# Patient Record
Sex: Female | Born: 1950 | Race: Black or African American | Hispanic: No | Marital: Married | Smoking: Never smoker
Health system: Southern US, Community
[De-identification: ages and names within clinical notes are randomized; demographics above are authoritative.]

## PROBLEM LIST (undated history)

## (undated) DIAGNOSIS — Z8619 Personal history of other infectious and parasitic diseases: Secondary | ICD-10-CM

## (undated) DIAGNOSIS — N854 Malposition of uterus: Secondary | ICD-10-CM

## (undated) DIAGNOSIS — E119 Type 2 diabetes mellitus without complications: Secondary | ICD-10-CM

## (undated) DIAGNOSIS — I10 Essential (primary) hypertension: Secondary | ICD-10-CM

## (undated) DIAGNOSIS — N390 Urinary tract infection, site not specified: Secondary | ICD-10-CM

## (undated) HISTORY — DX: Personal history of other infectious and parasitic diseases: Z86.19

## (undated) HISTORY — DX: Essential (primary) hypertension: I10

## (undated) HISTORY — PX: EYE SURGERY: SHX253

## (undated) HISTORY — DX: Urinary tract infection, site not specified: N39.0

## (undated) HISTORY — PX: KNEE SURGERY: SHX244

## (undated) HISTORY — DX: Malposition of uterus: N85.4

## (undated) HISTORY — PX: KNEE ARTHROSCOPY: SUR90

## (undated) HISTORY — DX: Type 2 diabetes mellitus without complications: E11.9

---

## 2000-06-10 ENCOUNTER — Emergency Department (HOSPITAL_COMMUNITY): Admission: EM | Admit: 2000-06-10 | Discharge: 2000-06-10 | Payer: Self-pay | Admitting: Emergency Medicine

## 2000-12-01 ENCOUNTER — Emergency Department (HOSPITAL_COMMUNITY): Admission: EM | Admit: 2000-12-01 | Discharge: 2000-12-01 | Payer: Self-pay | Admitting: Emergency Medicine

## 2003-02-02 ENCOUNTER — Encounter: Payer: Self-pay | Admitting: Orthopedic Surgery

## 2003-02-03 ENCOUNTER — Ambulatory Visit (HOSPITAL_COMMUNITY): Admission: RE | Admit: 2003-02-03 | Discharge: 2003-02-03 | Payer: Self-pay | Admitting: Orthopedic Surgery

## 2005-05-27 ENCOUNTER — Emergency Department (HOSPITAL_COMMUNITY): Admission: EM | Admit: 2005-05-27 | Discharge: 2005-05-27 | Payer: Self-pay | Admitting: Emergency Medicine

## 2006-12-21 ENCOUNTER — Emergency Department (HOSPITAL_COMMUNITY): Admission: EM | Admit: 2006-12-21 | Discharge: 2006-12-22 | Payer: Self-pay | Admitting: Emergency Medicine

## 2008-04-24 ENCOUNTER — Encounter: Admission: RE | Admit: 2008-04-24 | Discharge: 2008-04-24 | Payer: Self-pay | Admitting: Internal Medicine

## 2009-04-12 ENCOUNTER — Emergency Department (HOSPITAL_COMMUNITY): Admission: EM | Admit: 2009-04-12 | Discharge: 2009-04-12 | Payer: Self-pay | Admitting: Emergency Medicine

## 2009-04-16 ENCOUNTER — Emergency Department (HOSPITAL_COMMUNITY): Admission: EM | Admit: 2009-04-16 | Discharge: 2009-04-16 | Payer: Self-pay | Admitting: Emergency Medicine

## 2009-04-21 ENCOUNTER — Emergency Department (HOSPITAL_COMMUNITY): Admission: EM | Admit: 2009-04-21 | Discharge: 2009-04-21 | Payer: Self-pay | Admitting: Emergency Medicine

## 2009-05-14 ENCOUNTER — Encounter: Admission: RE | Admit: 2009-05-14 | Discharge: 2009-06-18 | Payer: Self-pay | Admitting: Diagnostic Neuroimaging

## 2009-09-04 ENCOUNTER — Emergency Department (HOSPITAL_COMMUNITY): Admission: EM | Admit: 2009-09-04 | Discharge: 2009-09-04 | Payer: Self-pay | Admitting: Emergency Medicine

## 2010-12-21 LAB — URIC ACID: Uric Acid, Serum: 8.7 mg/dL — ABNORMAL HIGH (ref 2.4–7.0)

## 2010-12-21 LAB — CBC
HCT: 34.8 % — ABNORMAL LOW (ref 36.0–46.0)
Hemoglobin: 11.7 g/dL — ABNORMAL LOW (ref 12.0–15.0)
WBC: 6.1 10*3/uL (ref 4.0–10.5)

## 2010-12-21 LAB — ANA: Anti Nuclear Antibody(ANA): NEGATIVE

## 2010-12-21 LAB — URINE CULTURE: Colony Count: 2000

## 2010-12-21 LAB — COMPREHENSIVE METABOLIC PANEL
AST: 24 U/L (ref 0–37)
BUN: 17 mg/dL (ref 6–23)
CO2: 24 mEq/L (ref 19–32)
Calcium: 8.8 mg/dL (ref 8.4–10.5)
Chloride: 105 mEq/L (ref 96–112)
Creatinine, Ser: 0.67 mg/dL (ref 0.4–1.2)
GFR calc Af Amer: >60 mL/min (ref >60)
GFR calc non Af Amer: >60 mL/min (ref >60)
Glucose, Bld: 118 mg/dL — ABNORMAL HIGH (ref 70–99)
Total Bilirubin: 0.6 mg/dL (ref 0.3–1.2)

## 2010-12-21 LAB — URINALYSIS, ROUTINE W REFLEX MICROSCOPIC
Bilirubin Urine: NEGATIVE
Glucose, UA: NEGATIVE mg/dL
Ketones, ur: NEGATIVE mg/dL
Nitrite: NEGATIVE
Protein, ur: NEGATIVE mg/dL
pH: 5.5 (ref 5.0–8.0)

## 2010-12-21 LAB — URINE MICROSCOPIC-ADD ON

## 2010-12-21 LAB — DIFFERENTIAL
Basophils Absolute: 0 10*3/uL (ref 0.0–0.1)
Eosinophils Relative: 5 % (ref 0–5)
Lymphocytes Relative: 32 % (ref 12–46)
Neutrophils Relative %: 54 % (ref 43–77)

## 2010-12-21 LAB — SEDIMENTATION RATE: Sed Rate: 40 mm/hr — ABNORMAL HIGH (ref 0–22)

## 2010-12-21 LAB — RHEUMATOID FACTOR: Rhuematoid fact SerPl-aCnc: 20 IU/mL (ref 0–20)

## 2011-01-31 NOTE — Op Note (Signed)
NAME:  BEAU, VANDUZER                         ACCOUNT NO.:  [id number]   MEDICAL RECORD NO.:  [id number]                   PATIENT TYPE:  AMB   LOCATION:  DAY                                  FACILITY:  [facility]   PHYSICIAN:  Georges Lynch. Darrelyn Hillock, M.D.             DATE OF BIRTH:  07/26/1952   DATE OF PROCEDURE:  02/03/2003  DATE OF DISCHARGE:                                 OPERATIVE REPORT   SURGEON:  Georges Lynch. Darrelyn Hillock, M.D.   ASSISTANT:  Nurse.   PREOPERATIVE DIAGNOSES:  1. Degenerative arthritis medial joint space, left knee.  2. Chronic synovitis, left knee.  3. Tear of posterior horn medial meniscus, left knee.   POSTOPERATIVE DIAGNOSES:  1. Degenerative arthritis medial joint space, left knee.  2. Chronic synovitis, left knee.  3. Tear of posterior horn medial meniscus, left knee.   OPERATION:  1. Diagnostic arthroscopy, left knee.  2. Medial meniscectomy of the posterior horn, left knee.  3. Abrasion chondroplasty, medial femoral condyle, left knee.  4. Synovectomy suprapatellar pouch, left knee.   DESCRIPTION OF PROCEDURE:  Under general anesthesia, routine orthopedic prep  and draping of the left lower extremity carried out. At this time, small  punctate incision made in the suprapatellar pouch, inflow canula was  inserted and the knee distended with saline. At this time, another small  punctate incision was made in the anterolateral joint, the arthroscope was  entered, a complete diagnostic arthroscopy was carried out. Immediately upon  entering the knee, I noticed she had a chronic synovitis in her knee. I did  a synovectomy in the suprapatellar pouch utilizing the shaver suction  device. I went down in the lateral joint space, it was clear. In the medial  joint, she had a large chondral fracture off of the distal aspect of the  medial femoral condyle. I shaved this out with a shaver suction device.  Following this, I then went into the posterior horn and did a  partial medial  meniscectomy. I thoroughly irrigated out the knee. She had a definite  chondral fracture which goes along with her fall that she had at school and  this was really not of an arthritic nature. The torn cartilage was also  related to injury. I thoroughly irrigated out the knee, closed all three  punctate incisions with a 3-0 nylon suture. Following this, sterile  dressings were applied after we injected 30 mL of 0.5% Marcaine with  epinephrine into the knee joint.   FOLLOWUP CARE:  She will be on Percocet 10/650 for pain, Bufferin one twice  a day as an anticoagulant, and I will see her in the office in 12-14 days or  prior to if there is a problem.  Ronald A. Darrelyn Hillock, M.D.   RAG/MEDQ  D:  02/03/2003  T:  02/03/2003  Job:  [id number]

## 2011-04-04 ENCOUNTER — Other Ambulatory Visit: Payer: Self-pay | Admitting: Internal Medicine

## 2011-04-04 DIAGNOSIS — Z1231 Encounter for screening mammogram for malignant neoplasm of breast: Secondary | ICD-10-CM

## 2012-05-13 ENCOUNTER — Encounter: Payer: Self-pay | Admitting: Obstetrics and Gynecology

## 2012-06-11 ENCOUNTER — Encounter: Payer: Self-pay | Admitting: Obstetrics and Gynecology

## 2012-06-11 ENCOUNTER — Ambulatory Visit (INDEPENDENT_AMBULATORY_CARE_PROVIDER_SITE_OTHER): Payer: BC Managed Care – PPO | Admitting: Obstetrics and Gynecology

## 2012-06-11 VITALS — BP 128/62 | Ht 60.0 in | Wt 186.0 lb

## 2012-06-11 DIAGNOSIS — Z124 Encounter for screening for malignant neoplasm of cervix: Secondary | ICD-10-CM

## 2012-06-11 DIAGNOSIS — E119 Type 2 diabetes mellitus without complications: Secondary | ICD-10-CM | POA: Insufficient documentation

## 2012-06-11 DIAGNOSIS — N854 Malposition of uterus: Secondary | ICD-10-CM | POA: Insufficient documentation

## 2012-06-11 DIAGNOSIS — I10 Essential (primary) hypertension: Secondary | ICD-10-CM | POA: Insufficient documentation

## 2012-06-11 DIAGNOSIS — Z01419 Encounter for gynecological examination (general) (routine) without abnormal findings: Secondary | ICD-10-CM

## 2012-06-11 NOTE — Progress Notes (Signed)
Contraception Post-Menopause Last pap Pt unsure of last pap Last Mammo n/a Last Colonoscopy 06/2011 Last Dexa Scan n/a Primary MD Gwenette Greet Abuse at Home None   No complaints.  Here because Dr. Allyne Gee told her she needed a pap smear.  Filed Vitals:   06/11/12 1431  BP: 128/62   ROS: noncontributory  Physical Examination: General appearance - alert, well appearing, and in no distress Neck - supple, no significant adenopathy Chest - clear to auscultation, no wheezes, rales or rhonchi, symmetric air entry Heart - normal rate and regular rhythm Abdomen - soft, nontender, nondistended, no masses or organomegaly Breasts - breasts appear normal, no suspicious masses, no skin or nipple changes or axillary nodes Pelvic - normal external genitalia, vulva, vagina, cervix, uterus and adnexa Back exam - no CVAT Extremities - no edema, redness or tenderness in the calves or thighs  A/P Obese Pap today AEX in 57yr mammo

## 2012-06-14 LAB — PAP IG W/ RFLX HPV ASCU

## 2012-12-16 ENCOUNTER — Encounter (HOSPITAL_COMMUNITY): Payer: Self-pay | Admitting: Emergency Medicine

## 2012-12-16 ENCOUNTER — Emergency Department (HOSPITAL_COMMUNITY)
Admission: EM | Admit: 2012-12-16 | Discharge: 2012-12-17 | Disposition: A | Discharge status: Home / Self Care | Payer: BC Managed Care – PPO | Attending: Emergency Medicine | Admitting: Emergency Medicine

## 2012-12-16 DIAGNOSIS — E119 Type 2 diabetes mellitus without complications: Secondary | ICD-10-CM | POA: Insufficient documentation

## 2012-12-16 DIAGNOSIS — T7840XA Allergy, unspecified, initial encounter: Secondary | ICD-10-CM

## 2012-12-16 DIAGNOSIS — R220 Localized swelling, mass and lump, head: Secondary | ICD-10-CM | POA: Insufficient documentation

## 2012-12-16 DIAGNOSIS — L299 Pruritus, unspecified: Secondary | ICD-10-CM | POA: Insufficient documentation

## 2012-12-16 DIAGNOSIS — I10 Essential (primary) hypertension: Secondary | ICD-10-CM | POA: Insufficient documentation

## 2012-12-16 DIAGNOSIS — Z79899 Other long term (current) drug therapy: Secondary | ICD-10-CM | POA: Insufficient documentation

## 2012-12-16 DIAGNOSIS — L272 Dermatitis due to ingested food: Secondary | ICD-10-CM | POA: Insufficient documentation

## 2012-12-16 HISTORY — DX: Type 2 diabetes mellitus without complications: E11.9

## 2012-12-16 MED ORDER — DIPHENHYDRAMINE HCL 50 MG/ML IJ SOLN
25.0000 mg | Freq: Once | INTRAMUSCULAR | Status: AC
  Administered 2012-12-16: 25 mg via INTRAVENOUS
  Filled 2012-12-16: qty 1

## 2012-12-16 MED ORDER — EPINEPHRINE 0.3 MG/0.3ML IJ DEVI: 0.3000 mg | INTRAMUSCULAR | Status: DC | PRN

## 2012-12-16 MED ORDER — METHYLPREDNISOLONE SODIUM SUCC 125 MG IJ SOLR
125.0000 mg | Freq: Once | INTRAMUSCULAR | Status: AC
  Administered 2012-12-16: 62.5 mg via INTRAVENOUS
  Filled 2012-12-16: qty 2

## 2012-12-16 MED ORDER — PREDNISONE 50 MG PO TABS: 50.0000 mg | ORAL_TABLET | Freq: Every day | ORAL | Status: DC

## 2012-12-16 NOTE — ED Provider Notes (Signed)
History     CSN: [id number]  Arrival date & time 12/16/12  2013   First MD Initiated Contact with Patient 12/16/12 2030      Chief Complaint  Patient presents with  . Allergic Reaction    (Consider location/radiation/quality/duration/timing/severity/associated sxs/prior treatment) HPI Pt states she ate seafood at 1830 tonight and began to have throat fullness and itching shortly after 1900. She took 2 zyrtec with mild relief. Pt states he was unable to find epi pen. No SOB, stridor, lip swelling. No N/V abd pain.  Past Medical History  Diagnosis Date  . Hypertension   . Diabetes mellitus without complication     Past Surgical History  Procedure Laterality Date  . Eye surgery      No family history on file.  History  Substance Use Topics  . Smoking status: Never Smoker   . Smokeless tobacco: Not on file  . Alcohol Use: No    OB History   Grav Para Term Preterm Abortions TAB SAB Ect Mult Living                  Review of Systems  Constitutional: Negative for fever and chills.  HENT: Negative for neck pain.   Respiratory: Negative for shortness of breath and stridor.   Cardiovascular: Negative for chest pain.  Gastrointestinal: Negative for nausea, vomiting and abdominal pain.  Musculoskeletal: Negative for back pain.  Skin: Negative for rash and wound.  Allergic/Immunologic: Positive for food allergies.  Neurological: Negative for dizziness, weakness, light-headedness and headaches.  All other systems reviewed and are negative.    Allergies  Shellfish allergy  Home Medications   Current Outpatient Rx  Name  Route  Sig  Dispense  Refill  . cetirizine (ZYRTEC) 10 MG tablet   Oral   Take 20 mg by mouth once.         Marland Kitchen EPINEPHrine (EPIPEN JR) 0.15 MG/0.3ML injection   Intramuscular   Inject 0.15 mg into the muscle daily as needed for anaphylaxis.         . fluticasone (FLONASE) 50 MCG/ACT nasal spray   Nasal   Place 2 sprays into the nose daily  as needed for rhinitis or allergies.         Marland Kitchen sitaGLIPtan-metformin (JANUMET) 50-500 MG per tablet   Oral   Take 1 tablet by mouth 2 (two) times daily with a meal.         . valsartan (DIOVAN) 40 MG tablet   Oral   Take 40 mg by mouth daily.         Marland Kitchen EPINEPHrine (EPIPEN) 0.3 mg/0.3 mL DEVI   Intramuscular   Inject 0.3 mLs (0.3 mg total) into the muscle as needed.   2 Device   0   . predniSONE (DELTASONE) 50 MG tablet   Oral   Take 1 tablet (50 mg total) by mouth daily.   5 tablet   0     BP 134/56  Pulse 105  Temp(Src) 98.6 F (37 C) (Oral)  Resp 20  SpO2 97%  Physical Exam  Nursing note and vitals reviewed. Constitutional: She is oriented to person, place, and time. She appears well-developed and well-nourished. No distress.  HENT:  Head: Normocephalic and atraumatic.  Mouth/Throat: Oropharynx is clear and moist.  No tongue or posterior pharynx swelling  Eyes: EOM are normal. Pupils are equal, round, and reactive to light.  Neck: Normal range of motion. Neck supple.  Cardiovascular: Normal rate and regular rhythm.  Pulmonary/Chest: Effort normal and breath sounds normal. No stridor. No respiratory distress. She has no wheezes. She has no rales.  Abdominal: Soft. Bowel sounds are normal. She exhibits no distension and no mass. There is no tenderness. There is no rebound and no guarding.  Musculoskeletal: Normal range of motion. She exhibits no edema and no tenderness.  Neurological: She is alert and oriented to person, place, and time.  Moves all ext without deficit. Sensation intact  Skin: Skin is warm and dry. No rash noted. No erythema.  Psychiatric: She has a normal mood and affect. Her behavior is normal.    ED Course  Procedures (including critical care time)  Labs Reviewed - No data to display No results found.   1. Allergic reaction, initial encounter       MDM  Pt feeling better after medication. Exam remains stable will continue to  observe with likely d/c home.   Pt remains symptom-free after 3 hours of observation. Return precautions given      Loren Racer, MD 12/16/12 2315

## 2012-12-16 NOTE — ED Notes (Addendum)
PT. REPORTS ITCHY THROAT AND LIP SWELLING AFTER EATING OYSTERS THIS EVENING , RESPIRATIONS UNLABORED / AIRWAY INTACT , BREATHS SOUNDS CLEAR BILATERALLY , PT. STATES SHE IS ALLERGIC TO SEA FOODS. PT. TOOK 2 TABS OF ZYRTEC PTA.

## 2013-09-19 ENCOUNTER — Encounter (HOSPITAL_COMMUNITY): Payer: Self-pay | Admitting: Emergency Medicine

## 2013-09-19 DIAGNOSIS — I10 Essential (primary) hypertension: Secondary | ICD-10-CM | POA: Insufficient documentation

## 2013-09-19 DIAGNOSIS — T7840XA Allergy, unspecified, initial encounter: Secondary | ICD-10-CM | POA: Insufficient documentation

## 2013-09-19 DIAGNOSIS — R221 Localized swelling, mass and lump, neck: Secondary | ICD-10-CM

## 2013-09-19 DIAGNOSIS — T628X1A Toxic effect of other specified noxious substances eaten as food, accidental (unintentional), initial encounter: Secondary | ICD-10-CM | POA: Insufficient documentation

## 2013-09-19 DIAGNOSIS — R220 Localized swelling, mass and lump, head: Secondary | ICD-10-CM | POA: Insufficient documentation

## 2013-09-19 DIAGNOSIS — R209 Unspecified disturbances of skin sensation: Secondary | ICD-10-CM | POA: Insufficient documentation

## 2013-09-19 DIAGNOSIS — Y9389 Activity, other specified: Secondary | ICD-10-CM | POA: Insufficient documentation

## 2013-09-19 DIAGNOSIS — Z79899 Other long term (current) drug therapy: Secondary | ICD-10-CM | POA: Insufficient documentation

## 2013-09-19 DIAGNOSIS — Y929 Unspecified place or not applicable: Secondary | ICD-10-CM | POA: Insufficient documentation

## 2013-09-19 DIAGNOSIS — E119 Type 2 diabetes mellitus without complications: Secondary | ICD-10-CM | POA: Insufficient documentation

## 2013-09-19 NOTE — ED Notes (Addendum)
Pt. Reports mild  right eyelid swelling , lips itchy /  tingling and throat swelling onset this evening after eating oriental pepper steak this afternoon . Airway intact / respirations unlabored , no throat swelling / speaking clearly  at triage . Pt. took 2 Benadryl tabs prior to arrival .

## 2013-09-19 NOTE — ED Notes (Signed)
Pt states she is feeling better and wants to leave, pt told that she needs to stay to be seen by a doctor and possibly receive more medications if the Doctor believes that she is having an allergic reaction. Pt aware and sat back in lobby.

## 2013-09-20 ENCOUNTER — Emergency Department (HOSPITAL_COMMUNITY)
Admission: EM | Admit: 2013-09-20 | Discharge: 2013-09-20 | Discharge status: Against Medical Advice | Payer: BC Managed Care – PPO | Attending: Emergency Medicine | Admitting: Emergency Medicine

## 2013-09-20 NOTE — ED Notes (Signed)
Called pt x3 attempts w/o response - unable to locate pt.

## 2013-09-20 NOTE — ED Notes (Signed)
Unable to locate pt after multiple attempts. 

## 2013-09-20 NOTE — ED Notes (Signed)
Called x3 w/o response - unable to locate pt 

## 2013-09-20 NOTE — ED Notes (Signed)
Attempted to call pt x3 w/ no response, unable to locate.

## 2014-04-11 ENCOUNTER — Other Ambulatory Visit: Payer: Self-pay

## 2014-04-11 DIAGNOSIS — Z1231 Encounter for screening mammogram for malignant neoplasm of breast: Secondary | ICD-10-CM

## 2014-05-01 ENCOUNTER — Ambulatory Visit
Admission: RE | Admit: 2014-05-01 | Discharge: 2014-05-01 | Disposition: A | Discharge status: Home / Self Care | Payer: BC Managed Care – PPO | Source: Ambulatory Visit

## 2014-05-01 DIAGNOSIS — Z1231 Encounter for screening mammogram for malignant neoplasm of breast: Secondary | ICD-10-CM

## 2014-05-03 ENCOUNTER — Other Ambulatory Visit: Payer: Self-pay | Admitting: Internal Medicine

## 2014-05-03 DIAGNOSIS — R928 Other abnormal and inconclusive findings on diagnostic imaging of breast: Secondary | ICD-10-CM

## 2014-05-09 ENCOUNTER — Ambulatory Visit
Admission: RE | Admit: 2014-05-09 | Discharge: 2014-05-09 | Disposition: A | Discharge status: Home / Self Care | Payer: BC Managed Care – PPO | Source: Ambulatory Visit | Attending: Internal Medicine | Admitting: Internal Medicine

## 2014-05-09 DIAGNOSIS — R928 Other abnormal and inconclusive findings on diagnostic imaging of breast: Secondary | ICD-10-CM

## 2014-07-17 ENCOUNTER — Encounter: Payer: Self-pay | Admitting: Obstetrics and Gynecology

## 2014-11-29 ENCOUNTER — Other Ambulatory Visit: Payer: Self-pay | Admitting: Internal Medicine

## 2014-11-29 DIAGNOSIS — N63 Unspecified lump in unspecified breast: Secondary | ICD-10-CM

## 2014-12-08 ENCOUNTER — Ambulatory Visit
Admission: RE | Admit: 2014-12-08 | Discharge: 2014-12-08 | Disposition: A | Discharge status: Home / Self Care | Payer: BLUE CROSS/BLUE SHIELD | Source: Ambulatory Visit | Attending: Internal Medicine | Admitting: Internal Medicine

## 2014-12-08 DIAGNOSIS — N63 Unspecified lump in unspecified breast: Secondary | ICD-10-CM

## 2015-06-27 ENCOUNTER — Other Ambulatory Visit: Payer: Self-pay | Admitting: Internal Medicine

## 2015-06-27 DIAGNOSIS — N6310 Unspecified lump in the right breast, unspecified quadrant: Secondary | ICD-10-CM

## 2015-07-11 ENCOUNTER — Ambulatory Visit
Admission: RE | Admit: 2015-07-11 | Discharge: 2015-07-11 | Disposition: A | Discharge status: Home / Self Care | Payer: BLUE CROSS/BLUE SHIELD | Source: Ambulatory Visit | Attending: Internal Medicine | Admitting: Internal Medicine

## 2015-07-11 DIAGNOSIS — N6310 Unspecified lump in the right breast, unspecified quadrant: Secondary | ICD-10-CM

## 2016-01-01 ENCOUNTER — Encounter (HOSPITAL_COMMUNITY): Payer: Self-pay

## 2016-01-01 ENCOUNTER — Emergency Department (HOSPITAL_COMMUNITY)
Admission: EM | Admit: 2016-01-01 | Discharge: 2016-01-01 | Disposition: A | Discharge status: Home / Self Care | Payer: BLUE CROSS/BLUE SHIELD | Attending: Emergency Medicine | Admitting: Emergency Medicine

## 2016-01-01 DIAGNOSIS — Y9289 Other specified places as the place of occurrence of the external cause: Secondary | ICD-10-CM | POA: Diagnosis not present

## 2016-01-01 DIAGNOSIS — T7840XA Allergy, unspecified, initial encounter: Secondary | ICD-10-CM | POA: Insufficient documentation

## 2016-01-01 DIAGNOSIS — X58XXXA Exposure to other specified factors, initial encounter: Secondary | ICD-10-CM | POA: Insufficient documentation

## 2016-01-01 DIAGNOSIS — Y9389 Activity, other specified: Secondary | ICD-10-CM | POA: Diagnosis not present

## 2016-01-01 DIAGNOSIS — Z79899 Other long term (current) drug therapy: Secondary | ICD-10-CM | POA: Diagnosis not present

## 2016-01-01 DIAGNOSIS — E119 Type 2 diabetes mellitus without complications: Secondary | ICD-10-CM | POA: Diagnosis not present

## 2016-01-01 DIAGNOSIS — F419 Anxiety disorder, unspecified: Secondary | ICD-10-CM | POA: Insufficient documentation

## 2016-01-01 DIAGNOSIS — I10 Essential (primary) hypertension: Secondary | ICD-10-CM | POA: Insufficient documentation

## 2016-01-01 DIAGNOSIS — Y998 Other external cause status: Secondary | ICD-10-CM | POA: Diagnosis not present

## 2016-01-01 MED ORDER — DIPHENHYDRAMINE HCL 25 MG PO TABS: 25.0000 mg | ORAL_TABLET | Freq: Four times a day (QID) | ORAL | Status: AC | PRN

## 2016-01-01 MED ORDER — DIPHENHYDRAMINE HCL 50 MG/ML IJ SOLN
12.5000 mg | Freq: Once | INTRAMUSCULAR | Status: AC
  Administered 2016-01-01: 12.5 mg via INTRAVENOUS
  Filled 2016-01-01: qty 1

## 2016-01-01 MED ORDER — FAMOTIDINE 20 MG PO TABS: 20.0000 mg | ORAL_TABLET | Freq: Two times a day (BID) | ORAL | Status: DC

## 2016-01-01 MED ORDER — METHYLPREDNISOLONE SODIUM SUCC 125 MG IJ SOLR
125.0000 mg | Freq: Once | INTRAMUSCULAR | Status: AC
  Administered 2016-01-01: 125 mg via INTRAVENOUS
  Filled 2016-01-01: qty 2

## 2016-01-01 MED ORDER — FAMOTIDINE IN NACL 20-0.9 MG/50ML-% IV SOLN
20.0000 mg | Freq: Once | INTRAVENOUS | Status: AC
  Administered 2016-01-01: 20 mg via INTRAVENOUS
  Filled 2016-01-01: qty 50

## 2016-01-01 NOTE — ED Notes (Signed)
Onset 2030 pt ate anchovies and suddenly started having itchy throat, throat and lip swelling.  Immediately took Benadryl 50 mg.

## 2016-01-01 NOTE — Discharge Instructions (Signed)

## 2016-01-01 NOTE — ED Provider Notes (Signed)
CSN: [id number]     Arrival date & time 01/01/16  2108 History   First MD Initiated Contact with Patient 01/01/16 2120     Chief Complaint  Patient presents with  . Allergic Reaction     (Consider location/radiation/quality/duration/timing/severity/associated sxs/prior Treatment) HPI Comments: 65 year old female with a history of hypertension and diabetes mellitus presents to the emergency department for evaluation of allergic reaction. Patient states that she ate anchovies at 2030 and began to feel itching in her posterior throat. She reports a sensation of swelling to her lower lip as well as to her throat. Patient took 50 mg of Benadryl by mouth prior to arrival for symptoms. She continues to complain of the itching sensation, but has no difficulty swallowing. She appears very anxious and is complaining of shortness of breath, though her oxygen saturations are 100% on room air. No other medications taken prior to arrival. She states that she has never eaten anchovies before. She does have an allergy to shellfish.  Patient is a 65 y.o. female presenting with allergic reaction. The history is provided by the patient. No language interpreter was used.  Allergic Reaction Presenting symptoms: no difficulty swallowing     Past Medical History  Diagnosis Date  . Hypertension   . Diabetes mellitus without complication [facility](HCC)    Past Surgical History  Procedure Laterality Date  . Eye surgery    . Knee arthroscopy     History reviewed. No pertinent family history. Social History  Substance Use Topics  . Smoking status: Never Smoker   . Smokeless tobacco: None  . Alcohol Use: No   OB History    No data available      Review of Systems  HENT: Positive for facial swelling. Negative for drooling and trouble swallowing.   Respiratory: Positive for shortness of breath.   Gastrointestinal: Negative for vomiting.  Neurological: Negative for syncope.  All other systems reviewed and are  negative.   Allergies  Shellfish allergy  Home Medications   Prior to Admission medications   Medication Sig Start Date End Date Taking? Authorizing Provider  b complex vitamins tablet Take 1 tablet by mouth daily.   Yes Historical Provider, MD  fluticasone (FLONASE) 50 MCG/ACT nasal spray Place 2 sprays into the nose daily as needed for rhinitis or allergies.   Yes Historical Provider, MD  levothyroxine (SYNTHROID, LEVOTHROID) 25 MCG tablet Take 25 mcg by mouth daily before breakfast.   Yes Historical Provider, MD  sitaGLIPtan-metformin (JANUMET) 50-500 MG per tablet Take 1 tablet by mouth 2 (two) times daily with a meal.   Yes Historical Provider, MD  valsartan (DIOVAN) 40 MG tablet Take 40 mg by mouth daily.   Yes Historical Provider, MD  diphenhydrAMINE (BENADRYL) 25 MG tablet Take 1 tablet (25 mg total) by mouth every 6 (six) hours as needed for itching or allergies (Rash). 01/01/16   Antony MaduraKelly Shafiq Larch, PA-C  EPINEPHrine (EPIPEN) 0.3 mg/0.3 mL DEVI Inject 0.3 mLs (0.3 mg total) into the muscle as needed. 12/16/12   Loren Raceravid Yelverton, MD  famotidine (PEPCID) 20 MG tablet Take 1 tablet (20 mg total) by mouth 2 (two) times daily. Take as needed for allergy symptoms 01/01/16   Antony MaduraKelly Kierstyn Baranowski, PA-C   BP 143/65 mmHg  Pulse 86  Temp(Src) 98.3 F (36.8 C) (Oral)  Resp 22  Wt 80.967 kg  SpO2 97%   Physical Exam  Constitutional: She is oriented to person, place, and time. She appears well-developed and well-nourished. No distress.  Nontoxic/nonseptic appearing.  HENT:  Head: Normocephalic and atraumatic.  Mouth/Throat: Oropharynx is clear and moist. No oropharyngeal exudate.  Oropharynx clear. No angioedema. Patient tolerating secretions without difficulty.  Eyes: Conjunctivae and EOM are normal. No scleral icterus.  Neck: Normal range of motion.  Pulmonary/Chest: Effort normal. No stridor. No respiratory distress. She has no wheezes. She has no rales.  Mild hyperventilation. Lungs CTAB. Chest  expansion symmetric. No stridor.  Musculoskeletal: Normal range of motion.  Neurological: She is alert and oriented to person, place, and time. She exhibits normal muscle tone. Coordination normal.  GCS 15. Patient moving all extremities.  Skin: Skin is warm and dry. No rash noted. She is not diaphoretic. No erythema. No pallor.  Psychiatric: Her behavior is normal. Her mood appears anxious.  Nursing note and vitals reviewed.   ED Course  Procedures (including critical care time) Labs Review Labs Reviewed - No data to display  Imaging Review No results found.   I have personally reviewed and evaluated these images and lab results as part of my medical decision-making.   EKG Interpretation None      10:41 PM Patient reports improvement in her symptoms. She is in no distress. She denies the feeling of her throat closing or itching. She is tolerating secretions without difficulty. No hypoxia or audible stridor. Will d/c with symptomatic treatment PRN. Patient comfortable with plan and verbalizes understanding.  MDM   Final diagnoses:  Allergic reaction, initial encounter    65 year old female presents to the emergency department for evaluation of an allergic reaction secondary to eating anchovies. Patient has a history of shellfish allergy. She was stable on arrival without hypoxia, tolerating secretions. No stridor noted. Lung sounds clear bilaterally. There is also no evidence of angioedema.  Patient treated supportively with IV Benadryl, Pepcid, and Solu-Medrol. On reevaluation, patient states that she no longer feels the sensation of her throat closing or itching. She states that she is feeling "better". Patient is comfortable with further outpatient management. I have recommended that she discontinue eating anchovies and follow-up with her primary care doctor. Benadryl and Pepcid recommended for any residual symptoms. Return precautions given at discharge. Patient agreeable to  plan with no unaddressed concerns; discharged in satisfactory condition.   Filed Vitals:   01/01/16 2113 01/01/16 2145 01/01/16 2200 01/01/16 2215  BP: 184/78 160/82 134/75 143/65  Pulse: 100 92 77 86  Temp: 98.3 F (36.8 C)     TempSrc: Oral     Resp: 22     Weight: 80.967 kg     SpO2: 100% 96% 97% 97%     Antony Madura, PA-C 01/01/16 2246  Gerhard Munch, MD 01/02/16 [phone]

## 2016-08-26 ENCOUNTER — Other Ambulatory Visit: Payer: Self-pay | Admitting: Internal Medicine

## 2016-08-26 DIAGNOSIS — N6310 Unspecified lump in the right breast, unspecified quadrant: Secondary | ICD-10-CM

## 2016-09-10 ENCOUNTER — Ambulatory Visit
Admission: RE | Admit: 2016-09-10 | Discharge: 2016-09-10 | Disposition: A | Discharge status: Home / Self Care | Payer: BLUE CROSS/BLUE SHIELD | Source: Ambulatory Visit | Attending: Internal Medicine | Admitting: Internal Medicine

## 2016-09-10 DIAGNOSIS — N6310 Unspecified lump in the right breast, unspecified quadrant: Secondary | ICD-10-CM

## 2017-07-02 DIAGNOSIS — Z86010 Personal history of colonic polyps: Secondary | ICD-10-CM | POA: Diagnosis not present

## 2017-07-02 DIAGNOSIS — Z1211 Encounter for screening for malignant neoplasm of colon: Secondary | ICD-10-CM | POA: Diagnosis not present

## 2017-08-01 DIAGNOSIS — Z1211 Encounter for screening for malignant neoplasm of colon: Secondary | ICD-10-CM | POA: Diagnosis not present

## 2017-08-01 DIAGNOSIS — K621 Rectal polyp: Secondary | ICD-10-CM | POA: Diagnosis not present

## 2017-08-01 DIAGNOSIS — D128 Benign neoplasm of rectum: Secondary | ICD-10-CM | POA: Diagnosis not present

## 2017-08-01 LAB — HM COLONOSCOPY

## 2017-08-12 DIAGNOSIS — H401134 Primary open-angle glaucoma, bilateral, indeterminate stage: Secondary | ICD-10-CM | POA: Diagnosis not present

## 2017-08-17 DIAGNOSIS — E039 Hypothyroidism, unspecified: Secondary | ICD-10-CM | POA: Diagnosis not present

## 2017-08-17 DIAGNOSIS — E785 Hyperlipidemia, unspecified: Secondary | ICD-10-CM | POA: Diagnosis not present

## 2017-08-17 DIAGNOSIS — Z23 Encounter for immunization: Secondary | ICD-10-CM | POA: Diagnosis not present

## 2017-08-17 DIAGNOSIS — E1165 Type 2 diabetes mellitus with hyperglycemia: Secondary | ICD-10-CM | POA: Diagnosis not present

## 2017-08-17 DIAGNOSIS — I10 Essential (primary) hypertension: Secondary | ICD-10-CM | POA: Diagnosis not present

## 2017-10-13 DIAGNOSIS — I10 Essential (primary) hypertension: Secondary | ICD-10-CM | POA: Diagnosis not present

## 2017-10-13 DIAGNOSIS — J209 Acute bronchitis, unspecified: Secondary | ICD-10-CM | POA: Diagnosis not present

## 2017-11-18 DIAGNOSIS — E559 Vitamin D deficiency, unspecified: Secondary | ICD-10-CM | POA: Diagnosis not present

## 2017-11-18 DIAGNOSIS — T783XXA Angioneurotic edema, initial encounter: Secondary | ICD-10-CM | POA: Diagnosis not present

## 2017-11-18 DIAGNOSIS — Z0000 Encounter for general adult medical examination without abnormal findings: Secondary | ICD-10-CM | POA: Diagnosis not present

## 2017-11-18 DIAGNOSIS — E1165 Type 2 diabetes mellitus with hyperglycemia: Secondary | ICD-10-CM | POA: Diagnosis not present

## 2017-11-18 DIAGNOSIS — I10 Essential (primary) hypertension: Secondary | ICD-10-CM | POA: Diagnosis not present

## 2017-12-11 DIAGNOSIS — H401134 Primary open-angle glaucoma, bilateral, indeterminate stage: Secondary | ICD-10-CM | POA: Diagnosis not present

## 2018-01-26 DIAGNOSIS — T7840XA Allergy, unspecified, initial encounter: Secondary | ICD-10-CM | POA: Diagnosis not present

## 2018-01-26 DIAGNOSIS — Z91013 Allergy to seafood: Secondary | ICD-10-CM | POA: Diagnosis not present

## 2018-03-22 DIAGNOSIS — E1165 Type 2 diabetes mellitus with hyperglycemia: Secondary | ICD-10-CM | POA: Diagnosis not present

## 2018-03-22 DIAGNOSIS — E2839 Other primary ovarian failure: Secondary | ICD-10-CM | POA: Diagnosis not present

## 2018-03-22 DIAGNOSIS — I10 Essential (primary) hypertension: Secondary | ICD-10-CM | POA: Diagnosis not present

## 2018-03-22 DIAGNOSIS — R252 Cramp and spasm: Secondary | ICD-10-CM | POA: Diagnosis not present

## 2018-03-31 ENCOUNTER — Other Ambulatory Visit: Payer: Self-pay | Admitting: Internal Medicine

## 2018-03-31 DIAGNOSIS — E2839 Other primary ovarian failure: Secondary | ICD-10-CM

## 2018-03-31 DIAGNOSIS — Z1231 Encounter for screening mammogram for malignant neoplasm of breast: Secondary | ICD-10-CM

## 2018-05-10 DIAGNOSIS — H401134 Primary open-angle glaucoma, bilateral, indeterminate stage: Secondary | ICD-10-CM | POA: Diagnosis not present

## 2018-05-10 DIAGNOSIS — H43813 Vitreous degeneration, bilateral: Secondary | ICD-10-CM | POA: Diagnosis not present

## 2018-05-10 DIAGNOSIS — E119 Type 2 diabetes mellitus without complications: Secondary | ICD-10-CM | POA: Diagnosis not present

## 2018-05-10 DIAGNOSIS — H5212 Myopia, left eye: Secondary | ICD-10-CM | POA: Diagnosis not present

## 2018-05-10 LAB — HM DIABETES EYE EXAM

## 2018-05-20 ENCOUNTER — Ambulatory Visit
Admission: RE | Admit: 2018-05-20 | Discharge: 2018-05-20 | Disposition: A | Discharge status: Home / Self Care | Payer: BLUE CROSS/BLUE SHIELD | Source: Ambulatory Visit | Attending: Internal Medicine | Admitting: Internal Medicine

## 2018-05-20 ENCOUNTER — Ambulatory Visit: Payer: BLUE CROSS/BLUE SHIELD

## 2018-05-20 DIAGNOSIS — E2839 Other primary ovarian failure: Secondary | ICD-10-CM

## 2018-05-20 DIAGNOSIS — Z1231 Encounter for screening mammogram for malignant neoplasm of breast: Secondary | ICD-10-CM | POA: Diagnosis not present

## 2018-05-20 DIAGNOSIS — Z780 Asymptomatic menopausal state: Secondary | ICD-10-CM | POA: Diagnosis not present

## 2018-05-20 DIAGNOSIS — M85852 Other specified disorders of bone density and structure, left thigh: Secondary | ICD-10-CM | POA: Diagnosis not present

## 2018-07-02 ENCOUNTER — Other Ambulatory Visit: Payer: Self-pay

## 2018-07-02 MED ORDER — VALSARTAN-HYDROCHLOROTHIAZIDE 160-12.5 MG PO TABS: 1.0000 | ORAL_TABLET | Freq: Every day | ORAL | 0 refills | Status: DC

## 2018-08-04 ENCOUNTER — Ambulatory Visit: Payer: Self-pay | Admitting: Internal Medicine

## 2018-08-24 ENCOUNTER — Ambulatory Visit (INDEPENDENT_AMBULATORY_CARE_PROVIDER_SITE_OTHER): Payer: BLUE CROSS/BLUE SHIELD | Admitting: Internal Medicine

## 2018-08-24 ENCOUNTER — Encounter: Payer: Self-pay | Admitting: Internal Medicine

## 2018-08-24 VITALS — BP 134/86 | HR 72 | Temp 98.1°F | Ht 60.0 in | Wt 179.0 lb

## 2018-08-24 DIAGNOSIS — I10 Essential (primary) hypertension: Secondary | ICD-10-CM | POA: Diagnosis not present

## 2018-08-24 DIAGNOSIS — Y9342 Activity, yoga: Secondary | ICD-10-CM

## 2018-08-24 DIAGNOSIS — E66811 Obesity, class 1: Secondary | ICD-10-CM | POA: Insufficient documentation

## 2018-08-24 DIAGNOSIS — E6609 Other obesity due to excess calories: Secondary | ICD-10-CM

## 2018-08-24 DIAGNOSIS — M25562 Pain in left knee: Secondary | ICD-10-CM | POA: Diagnosis not present

## 2018-08-24 DIAGNOSIS — Z6830 Body mass index (BMI) 30.0-30.9, adult: Secondary | ICD-10-CM | POA: Insufficient documentation

## 2018-08-24 DIAGNOSIS — G8929 Other chronic pain: Secondary | ICD-10-CM | POA: Insufficient documentation

## 2018-08-24 DIAGNOSIS — Z6833 Body mass index (BMI) 33.0-33.9, adult: Secondary | ICD-10-CM | POA: Insufficient documentation

## 2018-08-24 DIAGNOSIS — E7800 Pure hypercholesterolemia, unspecified: Secondary | ICD-10-CM | POA: Diagnosis not present

## 2018-08-24 DIAGNOSIS — E1165 Type 2 diabetes mellitus with hyperglycemia: Secondary | ICD-10-CM

## 2018-08-24 DIAGNOSIS — Z6834 Body mass index (BMI) 34.0-34.9, adult: Secondary | ICD-10-CM

## 2018-08-24 DIAGNOSIS — Z7982 Long term (current) use of aspirin: Secondary | ICD-10-CM

## 2018-08-24 NOTE — Progress Notes (Signed)
Subjective:     Patient ID: Tanya Levy , female    DOB: August 16, 1951 , 67 y.o.   MRN: [id number]   Chief Complaint  Patient presents with  . Diabetes  . Hypertension    HPI  Diabetes  She presents for her follow-up diabetic visit. She has type 2 diabetes mellitus. Her disease course has been improving. There are no hypoglycemic associated symptoms. Pertinent negatives for diabetes include no chest pain and no fatigue. There are no hypoglycemic complications. Risk factors for coronary artery disease include diabetes mellitus, dyslipidemia, hypertension, obesity, sedentary lifestyle and post-menopausal. She is compliant with treatment most of the time.  Hypertension  This is a chronic problem. The current episode started more than 1 year ago. The problem has been gradually improving since onset. The problem is controlled. Pertinent negatives include no chest pain.   She reports compliance with meds.   Past Medical History:  Diagnosis Date  . Diabetes mellitus ([place])   . History of chicken pox   . History of measles   . History of mumps   . Hypertension   . Tilted uterus   . UTI (urinary tract infection)      Family History  Problem Relation Age of Onset  . Hypertension Mother   . Hypertension Father      Current Outpatient Medications:  .  aspirin (ASPIR-LOW) 81 MG EC tablet, Take 81 mg by mouth daily. Swallow whole., Disp: , Rfl:  .  EPINEPHrine (EPIPEN 2-PAK) 0.3 mg/0.3 mL IJ SOAJ injection, EpiPen 2-Pak 0.3 mg/0.3 mL injection, auto-injector, Disp: , Rfl:  .  latanoprost (XALATAN) 0.005 % ophthalmic solution, , Disp: , Rfl:  .  sitaGLIPtan-metformin (JANUMET) 50-500 MG per tablet, Take 1 tablet by mouth 2 (two) times daily with a meal., Disp: , Rfl:  .  SYNTHROID 100 MCG tablet, Take 100 mcg by mouth daily., Disp: , Rfl: 2 .  timolol (TIMOPTIC-XR) 0.5 % ophthalmic gel-forming, INSTILL 1 DROP INTO BOTH EYES IN THE MORNING, Disp: , Rfl: 4 .  valsartan (DIOVAN) 40 MG  tablet, Take 40 mg by mouth daily., Disp: , Rfl:    Allergies  Allergen Reactions  . Shellfish Allergy      Review of Systems  Constitutional: Negative.  Negative for fatigue.  Respiratory: Negative.   Cardiovascular: Negative.  Negative for chest pain.  Genitourinary: Negative.   Musculoskeletal: Positive for arthralgias (she c/o left knee pain. there is some pain w/ ambulation. initially hurt it months ago while doing yoga. thinks she injured it b/c she did not stretch prior to yoga class).  Neurological: Negative.   Psychiatric/Behavioral: Negative.      Today's Vitals   08/24/18 1203  BP: 134/86  Pulse: 72  Temp: 98.1 F (36.7 C)  TempSrc: Oral  Weight: 179 lb (81.2 kg)  Height: 5' (1.524 m)   Body mass index is 34.96 kg/m.   Objective:  Physical Exam  Constitutional: She is oriented to person, place, and time. She appears well-developed and well-nourished.  HENT:  Head: Normocephalic and atraumatic.  Eyes: EOM are normal.  Cardiovascular: Normal rate, regular rhythm and normal heart sounds.  Pulmonary/Chest: Effort normal and breath sounds normal.  Neurological: She is alert and oriented to person, place, and time.  Psychiatric: She has a normal mood and affect.  Nursing note and vitals reviewed.       Assessment And Plan:     1. Uncontrolled type 2 diabetes mellitus with hyperglycemia ([place])  I will check labs  as listed below. She is encouraged to avoid sugary beverages and processed foods including white breads, rice and pasta.  - CMP14+EGFR - Hemoglobin A1c  2. Essential hypertension, benign  Fair control. She will continue with current meds. She is encouraged to avoid adding salt to her foods.   3. Pure hypercholesterolemia  She will continue with current meds. She is encouraged to avoid fried foods and to exercise four to five days weekly. She ONLY tolerates brand name Crestor.   - TSH  4. Chronic pain of left knee  I will refer her to Ortho  since her sx have not resolved. She is encouraged to apply topical pain cream to front and back of knee twice daily.   5. Class 1 obesity due to excess calories with serious comorbidity and body mass index (BMI) of 34.0 to 34.9 in adult  She is encouraged to strive for BMI less than 30 to decrease cardiac risk. She is encouraged to incorporate more exercise into her daly routine as tolerated.   [name], MD

## 2018-08-25 LAB — HEMOGLOBIN A1C
ESTIMATED AVERAGE GLUCOSE: 148 mg/dL
Hgb A1c MFr Bld: 6.8 % — ABNORMAL HIGH (ref 4.8–5.6)

## 2018-08-25 LAB — CMP14+EGFR
ALBUMIN: 4.5 g/dL (ref 3.6–4.8)
ALT: 26 IU/L (ref 0–32)
AST: 25 IU/L (ref 0–40)
Albumin/Globulin Ratio: 1.6 (ref 1.2–2.2)
Alkaline Phosphatase: 65 IU/L (ref 39–117)
BUN/Creatinine Ratio: 18 (ref 12–28)
BUN: 14 mg/dL (ref 8–27)
Bilirubin Total: 0.2 mg/dL (ref 0.0–1.2)
CALCIUM: 10 mg/dL (ref 8.7–10.3)
CHLORIDE: 101 mmol/L (ref 96–106)
CO2: 21 mmol/L (ref 20–29)
CREATININE: 0.78 mg/dL (ref 0.57–1.00)
GFR calc Af Amer: 92 mL/min/{1.73_m2} (ref >59)
GFR calc non Af Amer: 79 mL/min/{1.73_m2} (ref >59)
Globulin, Total: 2.8 g/dL (ref 1.5–4.5)
Glucose: 94 mg/dL (ref 65–99)
Potassium: 4.7 mmol/L (ref 3.5–5.2)
Sodium: 139 mmol/L (ref 134–144)
TOTAL PROTEIN: 7.3 g/dL (ref 6.0–8.5)

## 2018-08-25 LAB — TSH: TSH: 2.1 u[IU]/mL (ref 0.450–4.500)

## 2018-08-26 NOTE — Progress Notes (Signed)
Your kidney and liver fxn are nl. Your hba1c is 6.8, this is great. Continue with current meds. Your thyroid fxn is nl.

## 2018-08-31 ENCOUNTER — Telehealth: Payer: Self-pay

## 2018-08-31 NOTE — Telephone Encounter (Signed)
Left the pt a message to call back so that I can give her her lab results.

## 2018-08-31 NOTE — Telephone Encounter (Signed)
-----   Message from Dorothyann Pengobyn Sanders, MD sent at 08/26/2018 10:15 PM EST ----- Your kidney and liver fxn are nl. Your hba1c is 6.8, this is great. Continue with current meds. Your thyroid fxn is nl.

## 2018-09-24 ENCOUNTER — Other Ambulatory Visit: Payer: Self-pay | Admitting: Internal Medicine

## 2018-09-30 ENCOUNTER — Encounter (HOSPITAL_COMMUNITY): Payer: Self-pay

## 2018-10-05 ENCOUNTER — Encounter: Payer: Self-pay | Admitting: Internal Medicine

## 2018-11-08 DIAGNOSIS — H401134 Primary open-angle glaucoma, bilateral, indeterminate stage: Secondary | ICD-10-CM | POA: Diagnosis not present

## 2018-11-14 ENCOUNTER — Other Ambulatory Visit: Payer: Self-pay | Admitting: Internal Medicine

## 2018-11-16 DIAGNOSIS — J019 Acute sinusitis, unspecified: Secondary | ICD-10-CM | POA: Diagnosis not present

## 2018-12-23 ENCOUNTER — Encounter: Payer: Self-pay | Admitting: Internal Medicine

## 2018-12-27 ENCOUNTER — Other Ambulatory Visit: Payer: Self-pay | Admitting: Internal Medicine

## 2018-12-30 ENCOUNTER — Other Ambulatory Visit: Payer: Self-pay | Admitting: Internal Medicine

## 2018-12-30 ENCOUNTER — Telehealth: Payer: Self-pay | Admitting: Internal Medicine

## 2018-12-30 NOTE — Telephone Encounter (Signed)
PA STARTED [id number] CRESTOR 10MG 

## 2018-12-31 NOTE — Telephone Encounter (Signed)
CRESTOR 10MG  APPROVED 12/30/2018 - 12/30/2019

## 2019-01-03 ENCOUNTER — Telehealth: Payer: Self-pay

## 2019-01-03 NOTE — Telephone Encounter (Signed)
Patient/pharmacy has been notified that the crestor has been approved through 12/30/2019. YRL,RMA

## 2019-01-04 ENCOUNTER — Other Ambulatory Visit: Payer: Self-pay

## 2019-01-04 MED ORDER — CRESTOR 10 MG PO TABS: 10.0000 mg | ORAL_TABLET | Freq: Every day | ORAL | 1 refills | Status: DC

## 2019-01-16 ENCOUNTER — Other Ambulatory Visit: Payer: Self-pay | Admitting: Nurse Practitioner

## 2019-01-17 ENCOUNTER — Telehealth: Payer: Self-pay

## 2019-01-17 NOTE — Telephone Encounter (Signed)
Patient called stating the crestor was $240 for a 90 day supply. Ins told her to call [phone] to get it lowered tier she is unable to take generic.  Called [facility] and they said the Crestor was approved for 90 day supple and it is $76. Called pt and notified her of the price of medication and to let us know if she has any more problems picking up her med. YRL,RMA

## 2019-01-29 ENCOUNTER — Other Ambulatory Visit: Payer: Self-pay | Admitting: Internal Medicine

## 2019-02-10 ENCOUNTER — Other Ambulatory Visit: Payer: Self-pay

## 2019-02-11 ENCOUNTER — Other Ambulatory Visit: Payer: Self-pay

## 2019-02-14 ENCOUNTER — Encounter: Payer: Self-pay | Admitting: Internal Medicine

## 2019-02-14 ENCOUNTER — Ambulatory Visit (INDEPENDENT_AMBULATORY_CARE_PROVIDER_SITE_OTHER): Payer: BC Managed Care – PPO | Admitting: Internal Medicine

## 2019-02-14 ENCOUNTER — Other Ambulatory Visit: Payer: Self-pay

## 2019-02-14 VITALS — BP 140/76 | HR 95 | Temp 97.1°F | Ht 60.0 in | Wt 179.0 lb

## 2019-02-14 DIAGNOSIS — E785 Hyperlipidemia, unspecified: Secondary | ICD-10-CM | POA: Diagnosis not present

## 2019-02-14 DIAGNOSIS — Z0000 Encounter for general adult medical examination without abnormal findings: Secondary | ICD-10-CM | POA: Diagnosis not present

## 2019-02-14 DIAGNOSIS — E1165 Type 2 diabetes mellitus with hyperglycemia: Secondary | ICD-10-CM

## 2019-02-14 DIAGNOSIS — I10 Essential (primary) hypertension: Secondary | ICD-10-CM

## 2019-02-14 DIAGNOSIS — E1122 Type 2 diabetes mellitus with diabetic chronic kidney disease: Secondary | ICD-10-CM | POA: Diagnosis not present

## 2019-02-14 LAB — POCT URINALYSIS DIPSTICK
Bilirubin, UA: NEGATIVE
Glucose, UA: NEGATIVE
Ketones, UA: NEGATIVE
Leukocytes, UA: NEGATIVE
Nitrite, UA: NEGATIVE
Protein, UA: NEGATIVE
Spec Grav, UA: 1.025 (ref 1.010–1.025)
Urobilinogen, UA: 0.2 E.U./dL
pH, UA: 5.5 (ref 5.0–8.0)

## 2019-02-14 LAB — POCT UA - MICROALBUMIN
Albumin/Creatinine Ratio, Urine, POC: 30
Creatinine, POC: 200 mg/dL
Microalbumin Ur, POC: 10 mg/L

## 2019-02-14 MED ORDER — FLUTICASONE PROPIONATE 50 MCG/ACT NA SUSP: 2.0000 | Freq: Every day | NASAL | 1 refills | Status: DC

## 2019-02-14 NOTE — Patient Instructions (Signed)

## 2019-02-14 NOTE — Progress Notes (Signed)
Subjective:     Patient ID: Tanya Levy , female    DOB: Nov 27, 1950 , 68 y.o.   MRN: [id number]   Chief Complaint  Patient presents with  . Annual Exam  . Diabetes  . Hypertension    HPI  She is here today for a full physical examination. She is followed by Dr. Mancel Bale for her GYN exams. Her last visit was 2 years ago.   Diabetes  She presents for her follow-up diabetic visit. She has type 2 diabetes mellitus. Her disease course has been improving. There are no hypoglycemic associated symptoms. Pertinent negatives for diabetes include no chest pain and no fatigue. There are no hypoglycemic complications. Risk factors for coronary artery disease include diabetes mellitus, dyslipidemia, hypertension, obesity, sedentary lifestyle and post-menopausal. She is compliant with treatment most of the time. She is following a diabetic diet. She participates in exercise intermittently. Her breakfast blood glucose is taken between 8-9 am. Her breakfast blood glucose range is generally 110-130 mg/dl.  Hypertension  This is a chronic problem. The current episode started more than 1 year ago. The problem has been gradually improving since onset. The problem is controlled. Pertinent negatives include no chest pain. Risk factors for coronary artery disease include diabetes mellitus, dyslipidemia, obesity, sedentary lifestyle and post-menopausal state. Compliance problems include exercise.    She reports compliance with meds.   Past Medical History:  Diagnosis Date  . Diabetes mellitus ([place])   . Diabetes mellitus without complication ([place])   . History of chicken pox   . History of measles   . History of mumps   . Hypertension   . Tilted uterus   . UTI (urinary tract infection)      Family History  Problem Relation Age of Onset  . Hypertension Mother   . Hypertension Father      Current Outpatient Medications:  .  aspirin (ASPIR-LOW) 81 MG EC tablet, Take 81 mg by mouth daily. Swallow  whole., Disp: , Rfl:  .  b complex vitamins tablet, Take 1 tablet by mouth daily., Disp: , Rfl:  .  CRESTOR 10 MG tablet, Take 1 tablet (10 mg total) by mouth daily., Disp: 90 tablet, Rfl: 1 .  diphenhydrAMINE (BENADRYL) 25 MG tablet, Take 1 tablet (25 mg total) by mouth every 6 (six) hours as needed for itching or allergies ([place])., Disp: 30 tablet, Rfl: 0 .  EPINEPHrine (EPIPEN 2-PAK) 0.3 mg/0.3 mL IJ SOAJ injection, EpiPen 2-Pak 0.3 mg/0.3 mL injection, auto-injector, Disp: , Rfl:  .  fluticasone (FLONASE) 50 MCG/ACT nasal spray, Place 2 sprays into both nostrils daily., Disp: 16 g, Rfl: 1 .  JANUMET 50-1000 MG tablet, TAKE 1 TABLET TWICE A DAY WITH MEALS, Disp: 180 tablet, Rfl: 1 .  latanoprost (XALATAN) 0.005 % ophthalmic solution, , Disp: , Rfl:  .  levothyroxine (SYNTHROID, LEVOTHROID) 25 MCG tablet, Take 25 mcg by mouth daily before breakfast., Disp: , Rfl:  .  SYNTHROID 100 MCG tablet, TAKE 1 TABLET BY MOUTH EVERY DAY, Disp: 90 tablet, Rfl: 0 .  timolol (TIMOPTIC-XR) 0.5 % ophthalmic gel-forming, INSTILL 1 DROP INTO BOTH EYES IN THE MORNING, Disp: , Rfl: 4 .  valsartan-hydrochlorothiazide (DIOVAN-HCT) 160-12.5 MG tablet, TAKE 1 TABLET BY MOUTH EVERY DAY, Disp: 90 tablet, Rfl: 0   Allergies  Allergen Reactions  . Shellfish Allergy Anaphylaxis  . Shellfish Allergy      The patient states she uses none for birth control. Last LMP was No LMP recorded. Patient is postmenopausal.. Negative  for Dysmenorrhea Negative for: breast discharge, breast lump(s), breast pain and breast self exam. Associated symptoms include abnormal vaginal bleeding. Pertinent negatives include abnormal bleeding (hematology), anxiety, decreased libido, depression, difficulty falling sleep, dyspareunia, history of infertility, nocturia, sexual dysfunction, sleep disturbances, urinary incontinence, urinary urgency, vaginal discharge and vaginal itching. Diet regular.The patient states her exercise level is  minimal.   .  The patient's tobacco use is:  Social History   Tobacco Use  Smoking Status Never Smoker  Smokeless Tobacco Never Used  . She has been exposed to passive smoke. The patient's alcohol use is:  Social History   Substance and Sexual Activity  Alcohol Use Yes   Comment: occasional    Review of Systems  Constitutional: Negative.  Negative for fatigue.  HENT: Negative.   Eyes: Negative.   Respiratory: Negative.   Cardiovascular: Negative.  Negative for chest pain.  Gastrointestinal: Negative.   Endocrine: Negative.   Genitourinary: Negative.   Musculoskeletal: Negative.   Skin: Negative.   Allergic/Immunologic: Negative.   Neurological: Negative.   Hematological: Negative.   Psychiatric/Behavioral: Negative.      Today's Vitals   02/14/19 1133  BP: 140/76  Pulse: 95  Temp: (!) 97.1 F (36.2 C)  TempSrc: Oral  Weight: 179 lb (81.2 kg)  Height: 5' (1.524 m)   Body mass index is 34.96 kg/m.   Objective:  Physical Exam Vitals signs and nursing note reviewed.  Constitutional:      Appearance: Normal appearance. She is obese.  HENT:     Head: Normocephalic and atraumatic.     Right Ear: Tympanic membrane, ear canal and external ear normal.     Left Ear: Tympanic membrane, ear canal and external ear normal.     Nose: Nose normal.     Mouth/Throat:     Mouth: Mucous membranes are moist.     Pharynx: Oropharynx is clear.  Eyes:     Extraocular Movements: Extraocular movements intact.     Conjunctiva/sclera: Conjunctivae normal.     Pupils: Pupils are equal, round, and reactive to light.  Neck:     Musculoskeletal: Normal range of motion and neck supple.  Cardiovascular:     Rate and Rhythm: Normal rate and regular rhythm.     Pulses: Normal pulses.          Dorsalis pedis pulses are 2+ on the right side and 2+ on the left side.       Posterior tibial pulses are 2+ on the right side and 2+ on the left side.     Heart sounds: Normal heart sounds.  Pulmonary:      Effort: Pulmonary effort is normal.     Breath sounds: Normal breath sounds.  Chest:     Breasts:        Right: Normal. No swelling, bleeding, inverted nipple, mass or nipple discharge.        Left: Normal. No swelling, bleeding, inverted nipple, mass or nipple discharge.  Abdominal:     General: Abdomen is flat. Bowel sounds are normal.     Palpations: Abdomen is soft.  Genitourinary:    Comments: deferred Musculoskeletal: Normal range of motion.  Feet:     Right foot:     Protective Sensation: 5 sites tested. 5 sites sensed.     Skin integrity: Skin integrity normal.     Toenail Condition: Right toenails are normal.     Left foot:     Protective Sensation: 5 sites tested. 5 sites sensed.  Skin integrity: Skin integrity normal.     Toenail Condition: Left toenails are normal.  Skin:    General: Skin is warm and dry.  Neurological:     General: No focal deficit present.     Mental Status: She is alert and oriented to person, place, and time.  Psychiatric:        Mood and Affect: Mood normal.        Behavior: Behavior normal.         Assessment And Plan:     1. Routine general medical examination at health care facility  A full exam was performed. Importance of monthly self breast exams was discussed with the patient.  PATIENT HAS BEEN ADVISED TO GET 30-45 MINUTES REGULAR EXERCISE NO LESS THAN FOUR TO FIVE DAYS PER WEEK - BOTH WEIGHTBEARING EXERCISES AND AEROBIC ARE RECOMMENDED.  SHE IS ADVISED TO FOLLOW A HEALTHY DIET WITH AT LEAST SIX FRUITS/VEGGIES PER DAY, DECREASE INTAKE OF RED MEAT, AND TO INCREASE FISH INTAKE TO TWO DAYS PER WEEK.  MEATS/FISH SHOULD NOT BE FRIED, BAKED OR BROILED IS PREFERABLE.  I SUGGEST WEARING SPF 50 SUNSCREEN ON EXPOSED PARTS AND ESPECIALLY WHEN IN THE DIRECT SUNLIGHT FOR AN EXTENDED PERIOD OF TIME.  PLEASE AVOID FAST FOOD RESTAURANTS AND INCREASE YOUR WATER INTAKE.  - CMP14+EGFR - CBC - Lipid panel - Hemoglobin A1c  2. Uncontrolled type 2  diabetes mellitus with hyperglycemia (HCC)  Diabetic foot exam was performed.  I DISCUSSED WITH THE PATIENT AT LENGTH REGARDING THE GOALS OF GLYCEMIC CONTROL AND POSSIBLE LONG-TERM COMPLICATIONS.  I  ALSO STRESSED THE IMPORTANCE OF COMPLIANCE WITH HOME GLUCOSE MONITORING, DIETARY RESTRICTIONS INCLUDING AVOIDANCE OF SUGARY DRINKS/PROCESSED FOODS,  ALONG WITH REGULAR EXERCISE.  I  ALSO STRESSED THE IMPORTANCE OF ANNUAL EYE EXAMS, SELF FOOT CARE AND COMPLIANCE WITH OFFICE VISITS.   3. Essential hypertension, benign  Fair control. She has yet to take her meds today, since she is fasting. She will continue with current meds. She is encouraged to avoid adding salt to her foods. Importance of regular exercise was discussed with the patient. She will be re-evaluated in four months. EKG performed, no new changes noted.   - EKG 12-Lead        [name], MD    THE PATIENT IS ENCOURAGED TO PRACTICE SOCIAL DISTANCING DUE TO THE COVID-19 PANDEMIC.

## 2019-02-15 LAB — LIPID PANEL
Chol/HDL Ratio: 4.2 ratio (ref 0.0–4.4)
Cholesterol, Total: 167 mg/dL (ref 100–199)
HDL: 40 mg/dL (ref >39)
LDL Calculated: 98 mg/dL (ref 0–99)
Triglycerides: 145 mg/dL (ref 0–149)
VLDL Cholesterol Cal: 29 mg/dL (ref 5–40)

## 2019-02-15 LAB — CMP14+EGFR
ALT: 43 IU/L — ABNORMAL HIGH (ref 0–32)
AST: 36 IU/L (ref 0–40)
Albumin/Globulin Ratio: 1.8 (ref 1.2–2.2)
Albumin: 4.8 g/dL (ref 3.8–4.8)
Alkaline Phosphatase: 72 IU/L (ref 39–117)
BUN/Creatinine Ratio: 24 (ref 12–28)
BUN: 19 mg/dL (ref 8–27)
Bilirubin Total: 0.3 mg/dL (ref 0.0–1.2)
CO2: 23 mmol/L (ref 20–29)
Calcium: 9.8 mg/dL (ref 8.7–10.3)
Chloride: 101 mmol/L (ref 96–106)
Creatinine, Ser: 0.78 mg/dL (ref 0.57–1.00)
GFR calc Af Amer: 92 mL/min/{1.73_m2} (ref >59)
GFR calc non Af Amer: 79 mL/min/{1.73_m2} (ref >59)
Globulin, Total: 2.6 g/dL (ref 1.5–4.5)
Glucose: 106 mg/dL — ABNORMAL HIGH (ref 65–99)
Potassium: 4.5 mmol/L (ref 3.5–5.2)
Sodium: 141 mmol/L (ref 134–144)
Total Protein: 7.4 g/dL (ref 6.0–8.5)

## 2019-02-15 LAB — CBC
Hematocrit: 35.1 % (ref 34.0–46.6)
Hemoglobin: 10.9 g/dL — ABNORMAL LOW (ref 11.1–15.9)
MCH: 25.8 pg — ABNORMAL LOW (ref 26.6–33.0)
MCHC: 31.1 g/dL — ABNORMAL LOW (ref 31.5–35.7)
MCV: 83 fL (ref 79–97)
Platelets: 246 10*3/uL (ref 150–450)
RBC: 4.23 x10E6/uL (ref 3.77–5.28)
RDW: 15.1 % (ref 11.7–15.4)
WBC: 5.8 10*3/uL (ref 3.4–10.8)

## 2019-02-15 LAB — HEMOGLOBIN A1C
Est. average glucose Bld gHb Est-mCnc: 146 mg/dL
Hgb A1c MFr Bld: 6.7 % — ABNORMAL HIGH (ref 4.8–5.6)

## 2019-03-15 ENCOUNTER — Other Ambulatory Visit: Payer: Self-pay | Admitting: Internal Medicine

## 2019-03-17 ENCOUNTER — Telehealth: Payer: Self-pay

## 2019-03-17 NOTE — Telephone Encounter (Signed)
Called pt insurance for crestor prior auth continuation. They are going to fax the letter within 72 hours

## 2019-03-21 ENCOUNTER — Other Ambulatory Visit: Payer: Self-pay

## 2019-03-21 DIAGNOSIS — E7800 Pure hypercholesterolemia, unspecified: Secondary | ICD-10-CM

## 2019-03-23 ENCOUNTER — Telehealth: Payer: Self-pay

## 2019-03-24 ENCOUNTER — Ambulatory Visit: Payer: Self-pay

## 2019-03-24 ENCOUNTER — Telehealth: Payer: Self-pay

## 2019-03-24 DIAGNOSIS — E7800 Pure hypercholesterolemia, unspecified: Secondary | ICD-10-CM

## 2019-03-24 DIAGNOSIS — E1165 Type 2 diabetes mellitus with hyperglycemia: Secondary | ICD-10-CM

## 2019-03-24 DIAGNOSIS — I10 Essential (primary) hypertension: Secondary | ICD-10-CM

## 2019-03-24 NOTE — Chronic Care Management (AMB) (Signed)
  Chronic Care Management   Outreach Note  03/24/2019 Name: Tanya Levy MRN: [id number] DOB: [birth year]  Referred by: Glendale Chard, MD Reason for referral : Care Coordination   An unsuccessful telephone outreach was attempted today. The patient was referred to the case management team by for assistance with chronic care management and care coordination.   Follow Up Plan: A HIPPA compliant phone message was left for the patient providing contact information and requesting a return call.  The care management team will reach out to the patient again over the next 7 days.   Daneen Schick, BSW, CDP Social Worker, Certified Dementia Practitioner [place] [facility] [place] [facility] [phone]

## 2019-03-24 NOTE — Chronic Care Management (AMB) (Deleted)
  Chronic Care Management   Social Work General Note  03/24/2019 Name: Tanya Levy MRN: [id number] DOB: Apr 28, 1951  Tanya Levy is a 68 y.o. year old female who is a primary care patient of Glendale Chard, MD. The CCM was consulted to assist the patient with care management.  Tanya Levy was given information about Chronic Care Management services today including:  1. CCM service includes personalized support from designated clinical staff supervised by her physician, including individualized plan of care and coordination with other care providers 2. 24/7 contact phone numbers for assistance for urgent and routine care needs. 3. Service will only be billed when office clinical staff spend 20 minutes or more in a month to coordinate care. 4. Only one practitioner may furnish and bill the service in a calendar month. 5. The patient may stop CCM services at any time (effective at the end of the month) by phone call to the office staff. 6. The patient will be responsible for cost sharing (co-pay) of up to 20% of the service fee (after annual deductible is met).  Patient agreed to services and verbal consent obtained.   Review of patient status, including review of consultants reports, relevant laboratory and other test results, and collaboration with appropriate care team members and the patient's provider was performed as part of comprehensive patient evaluation and provision of chronic care management services.    During today's call the patient indicates she is in need of assistance with the re-certification process related to Crestor patient assistance. The patient reports the re-certification is due this month which will enable her to continue receiving Crestor at a reduced co-payment rate.   Goals Addressed            This Visit's Progress     Patient Stated   . "I need a recertification to assist with Crestor costs" (pt-stated)       Current Barriers:  . Financial  constraints  Clinical Social Work Clinical Goal(s):  Marland Kitchen Over the next 45 days, patient will work with chronic care management team to address needs related to medication assistance.  Interventions: . Patient interviewed and appropriate assessments performed . Discussed plans with patient for ongoing care management follow up and provided patient with direct contact information for care management team . Advised patient to expect a call from embedded PharmD Lottie Dawson and RN Case Manager Glenard Haring Little to assist with disease management needs . Collaborated with RN Case Manager re: patient enrollment into CCM program . Collaboration with Lottie Dawson PharmD regarding patient assistance needed for Crestor co-pay . Scheduled follow up call with the patient to complete SDOH screen in the next 4 weeks  Patient Self Care Activities:  . Self administers medications as prescribed . Performs ADL's independently . Calls provider office for new concerns or questions  Initial goal documentation         Follow Up Plan: SW will follow up with patient by phone over the next 4 weeks       Daneen Schick, BSW, CDP Social Worker, Certified Dementia Practitioner [place] [facility] [place] [facility] [phone]  Total time spent performing care coordination and/or care management activities with the patient by phone or face to face = 18 minutes.

## 2019-03-24 NOTE — Patient Instructions (Addendum)
Visit Information  Goals Addressed            This Visit's Progress     Patient Stated   . "I need a recertification to assist with Crestor costs" (pt-stated)       Current Barriers:  . Financial constraints  Clinical Social Work Clinical Goal(s):  Marland Kitchen Over the next 45 days, patient will work with chronic care management team to address needs related to medication assistance.  Interventions: . Patient interviewed and appropriate assessments performed . Discussed plans with patient for ongoing care management follow up and provided patient with direct contact information for care management team . Advised patient to expect a call from embedded PharmD Lottie Dawson and RN Case Manager Glenard Haring Little to assist with disease management needs . Collaborated with RN Case Manager re: patient enrollment into CCM program . Collaboration with Lottie Dawson PharmD regarding patient assistance needed for Crestor co-pay . Scheduled follow up call with the patient to complete SDOH screen in the next 4 weeks  Patient Self Care Activities:  . Self administers medications as prescribed . Performs ADL's independently . Calls provider office for new concerns or questions  Initial goal documentation        The patient verbalized understanding of instructions provided today and declined a print copy of patient instruction materials.   The care management team will reach out to the patient again over the next 21 days.   Daneen Schick, BSW, CDP Social Worker, Certified Dementia Practitioner [place] [facility] [place] [facility] [phone]

## 2019-03-24 NOTE — Chronic Care Management (AMB) (Signed)
  Care Management   Note  03/24/2019 Name: Tanya Levy MRN: [id number] DOB: 1951/07/18   Chronic Care Management   Initial Visit Note  03/24/2019 Name: Tanya Levy MRN: [id number] DOB: Oct 15, 1950  Referred by: Glendale Chard, MD Reason for referral : No chief complaint on file.   Tanya Levy is a 68 y.o. year old female who is a primary care patient of Glendale Chard, MD. The care management team was consulted for assistance with chronic disease management and care coordination needs.   Review of patient status, including review of consultants reports, relevant laboratory and other test results, and collaboration with appropriate care team members and the patient's provider was performed as part of comprehensive patient evaluation and provision of chronic care management services.    I initiated and established the plan of care for Tanya Levy during one on one collaboration with my clinical care management colleague Daneen Schick BSW who is also engaged with this patient to address social work needs.   Goals Addressed    . Assist with Chronic Disease Management and Care Coordination needs       Current Barriers:  Marland Kitchen Knowledge Barriers related to resources and support available to address needs related to Chronic disease management and Community Resources  Case Manager Clinical Goal(s):  Marland Kitchen Over the next 30 days, patient will work with the CCM team to address needs related to Chronic disease management, Medication management and Care Coordination needs.   Interventions:  . Collaborated with BSW and initiated plan of care to address needs related to chronic disease management and medication management   Patient Self Care Activities:  . Self administers medications as prescribed . Attends all scheduled provider appointments . Calls pharmacy for medication refills . Performs ADL's independently . Performs IADL's independently . Calls provider  office for new concerns or questions  Initial goal documentation         Telephone follow up appointment with care management team member scheduled for: 04/13/19  Barb Merino, RN, BSN, CCM Care Management Coordinator [facility]  Direct Phone: [phone]

## 2019-03-24 NOTE — Chronic Care Management (AMB) (Signed)
  Care Management    03/24/2019 Name: Tanya Levy MRN: [id number] DOB: [birth year]  Referred by: Glendale Chard, MD Reason for referral : Care Coordination   Tanya Levy is a 68 y.o. year old female who is a primary care patient of Glendale Chard, MD. The care management team was consulted for assistance with chronic disease management and care coordination needs.   Review of patient status, including review of consultants reports, relevant laboratory and other test results, and collaboration with appropriate care team members and the patient's provider was performed as part of comprehensive patient evaluation and provision of care management services.   I received an incoming call from the patient who reports needing assistance with re-certifying her patient assistance application for Crestor.  Goals Addressed            This Visit's Progress     Patient Stated   . "I need a recertification to assist with Crestor costs" (pt-stated)       Current Barriers:  . Financial constraints  Clinical Social Work Clinical Goal(s):  Marland Kitchen Over the next 45 days, patient will work with chronic care management team to address needs related to medication assistance.  Interventions: . Patient interviewed and appropriate assessments performed . Discussed plans with patient for ongoing care management follow up and provided patient with direct contact information for care management team . Advised patient to expect a call from embedded PharmD Lottie Dawson and RN Case Manager Glenard Haring Little to assist with disease management needs . Collaborated with RN Case Manager re: patient enrollment into CCM program . Collaboration with Lottie Dawson PharmD regarding patient assistance needed for Crestor co-pay . Scheduled follow up call with the patient to complete SDOH screen in the next 4 weeks  Patient Self Care Activities:  . Self administers medications as prescribed . Performs ADL's  independently . Calls provider office for new concerns or questions  Initial goal documentation          Tanya Levy was given information about Care Management services today including:  1. Care Management services includes personalized support from designated clinical staff supervised by her physician, including individualized plan of care and coordination with other care providers 2. 24/7 contact phone numbers for assistance for urgent and routine care needs. 3. The patient may stop case management services at any time by phone call to the office staff.  Patient agreed to services and verbal consent obtained.    Follow up plan: CCM SW will reach out to the patient again over the next four weeks.  Daneen Schick, BSW, CDP Social Worker, Certified Dementia Practitioner [place] [facility] [place] [facility] [phone]

## 2019-03-28 ENCOUNTER — Telehealth: Payer: Self-pay

## 2019-03-30 ENCOUNTER — Telehealth: Payer: Medicare Other | Admitting: Pharmacist

## 2019-04-06 ENCOUNTER — Ambulatory Visit: Payer: Self-pay | Admitting: Pharmacist

## 2019-04-06 ENCOUNTER — Other Ambulatory Visit (INDEPENDENT_AMBULATORY_CARE_PROVIDER_SITE_OTHER): Payer: BC Managed Care – PPO

## 2019-04-06 ENCOUNTER — Telehealth: Payer: Self-pay

## 2019-04-06 DIAGNOSIS — Z1211 Encounter for screening for malignant neoplasm of colon: Secondary | ICD-10-CM | POA: Diagnosis not present

## 2019-04-06 DIAGNOSIS — E1165 Type 2 diabetes mellitus with hyperglycemia: Secondary | ICD-10-CM

## 2019-04-06 DIAGNOSIS — I10 Essential (primary) hypertension: Secondary | ICD-10-CM

## 2019-04-06 DIAGNOSIS — E7800 Pure hypercholesterolemia, unspecified: Secondary | ICD-10-CM

## 2019-04-06 LAB — HEMOCCULT GUIAC POC 1CARD (OFFICE)
Card #1 Date: 6252020
Card #2 Date: 6262020
Card #2 Fecal Occult Blod, POC: NEGATIVE
Card #3 Date: 6272020
Card #3 Fecal Occult Blood, POC: NEGATIVE
Fecal Occult Blood, POC: NEGATIVE

## 2019-04-06 NOTE — Telephone Encounter (Signed)
Pt approved for crestor until 04/04/2020

## 2019-04-07 ENCOUNTER — Other Ambulatory Visit: Payer: Self-pay | Admitting: Internal Medicine

## 2019-04-07 NOTE — Patient Instructions (Signed)
Visit Information  Goals Addressed            This Visit's Progress     Patient Stated   . "I need a recertification to assist with Crestor costs" (pt-stated)       Current Barriers:  . Financial constraints  PharmD & Social Work Clinical Goal(s):  Marland Kitchen Over the next 45 days, patient will work with chronic care management team to address needs related to medication assistance.  Social Work Interventions: . Patient interviewed and appropriate assessments performed . Discussed plans with patient for ongoing care management follow up and provided patient with direct contact information for care management team . Advised patient to expect a call from embedded PharmD Lottie Dawson and RN Case Manager Glenard Haring Little to assist with disease management needs . Collaborated with RN Case Manager re: patient enrollment into CCM program . Collaboration with Lottie Dawson PharmD regarding patient assistance needed for Crestor co-pay . Scheduled follow up call with the patient to complete SDOH screen in the next 4 weeks  PharmD Interventions: . 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 . Submitted prior authorization request and verified that patient has tried and failed 3 statins per insurance guidelines.   . Patient has been approved for brand name Crestor until 04/06/19 per [employer]. . Patient had 90-day supply filled on 02/14/19, so she has remained compliant with medication. . Will continue to follow and encourage statin compliance .  Patient Self Care Activities:  . Self administers medications as prescribed . Performs ADL's independently . Calls provider office for new concerns or questions  Please see past updates related to this goal by clicking on the "Past Updates" button in the selected goal         The patient verbalized understanding of instructions provided today and declined a print copy of patient  instruction materials.   The care management team will reach out to the patient again over the next 14 days.   Regina Eck, PharmD, BCPS Clinical Pharmacist, [facility] [place]: [phone]

## 2019-04-07 NOTE — Chronic Care Management (AMB) (Signed)
  Chronic Care Management   Initial Visit Note  04/06/2019 Name: Tanya Levy MRN: [id number] DOB: 1950-12-26  Referred by: Glendale Chard, MD Reason for referral : Chronic Care Management   Tanya Levy is a 68 y.o. year old female who is a primary care patient of Glendale Chard, MD. The CCM team was consulted for assistance with chronic disease management and care coordination needs.   Review of patient status, including review of consultants reports, relevant laboratory and other test results, and collaboration with appropriate care team members and the patient's provider was performed as part of comprehensive patient evaluation and provision of chronic care management services.     Objective:   Goals Addressed            This Visit's Progress     Patient Stated   . "I need a recertification to assist with Crestor costs" (pt-stated)       Current Barriers:  . Financial constraints  PharmD & Social Work Clinical Goal(s):  Marland Kitchen Over the next 45 days, patient will work with chronic care management team to address needs related to medication assistance.  Social Work Interventions: . Patient interviewed and appropriate assessments performed . Discussed plans with patient for ongoing care management follow up and provided patient with direct contact information for care management team . Advised patient to expect a call from embedded PharmD Tanya Levy and RN Case Manager Tanya Levy to assist with disease management needs . Collaborated with RN Case Manager re: patient enrollment into CCM program . Collaboration with Tanya Levy PharmD regarding patient assistance needed for Crestor co-pay . Scheduled follow up call with the patient to complete SDOH screen in the next 4 weeks  PharmD Interventions: . 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 . Submitted prior authorization request  and verified that patient has tried and failed 3 statins per insurance guidelines.   . Patient has been approved for brand name Crestor until 04/06/19 per [employer]. . Patient had 90-day supply filled on 02/14/19, so she has remained compliant with medication. . Will continue to follow and encourage statin compliance .  Patient Self Care Activities:  . Self administers medications as prescribed . Performs ADL's independently . Calls provider office for new concerns or questions  Please see past updates related to this goal by clicking on the "Past Updates" button in the selected goal          Plan:   The care management team will reach out to the patient again over the next 14 days.   Regina Eck, PharmD, BCPS Clinical Pharmacist, [facility] [place]: [phone]

## 2019-04-13 ENCOUNTER — Telehealth: Payer: Self-pay

## 2019-04-14 ENCOUNTER — Ambulatory Visit: Payer: Self-pay | Admitting: Pharmacist

## 2019-04-14 ENCOUNTER — Ambulatory Visit: Payer: Medicare Other

## 2019-04-14 DIAGNOSIS — E7800 Pure hypercholesterolemia, unspecified: Secondary | ICD-10-CM

## 2019-04-14 DIAGNOSIS — E1165 Type 2 diabetes mellitus with hyperglycemia: Secondary | ICD-10-CM

## 2019-04-14 NOTE — Patient Instructions (Signed)
Visit Information  Goals Addressed            This Visit's Progress     Patient Stated   . "I need a recertification to assist with Crestor costs" (pt-stated)       Current Barriers:  . Financial constraints  PharmD & Social Work Clinical Goal(s):  Marland Kitchen Over the next 45 days, patient will work with chronic care management team to address needs related to medication assistance.  Social Work Interventions: Completed 04/14/2019 . Outbound call to the patient to assess outcome of patient assistance application and determine whether the patient may need SW intervention to off-set costs . Advised by the patient she has received both an approval and denial letter from patient assistance "I don't know which is right" . Informed the patient she had an [place] appointment with PharmD Lottie Dawson next week and that this writer would communicate with Almyra Free regarding letters received . Collaboration with Lottie Dawson via in basket message requesting she assist the patient in determining which letter has correct information . Completed SDOH screen with the patient. The patient indicates no challenges at this time . Advised the patient to contact CCM SW for future resource needs and to expect a call from PharmD for further assistance with Crestor  PharmD Interventions: . 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 . Submitted prior authorization request and verified that patient has tried and failed 3 statins per insurance guidelines.   . Patient has been approved for brand name Crestor until 04/06/19 per [employer]. . Patient had 90-day supply filled on 02/14/19, so she has remained compliant with medication. . Will continue to follow and encourage statin compliance .  Patient Self Care Activities:  . Self administers medications as prescribed . Performs ADL's independently . Calls provider office for new concerns or  questions  Please see past updates related to this goal by clicking on the "Past Updates" button in the selected goal         No further SW follow up planned at this time. The patient will be contacted by embedded PharmD for further assistance.  Daneen Schick, BSW, CDP Social Worker, Certified Dementia Practitioner [place] [facility] [place] [facility] [phone]

## 2019-04-14 NOTE — Chronic Care Management (AMB) (Signed)
  Care Management   Follow Up Note   04/14/2019 Name: Tanya Levy MRN: [id number] DOB: 09/17/1950  Referred by: Glendale Chard, MD Reason for referral : Care Coordination   Tanya Levy is a 68 y.o. year old female who is a primary care patient of Glendale Chard, MD. The care management team was consulted for assistance with care management and care coordination needs.    Review of patient status, including review of consultants reports, relevant laboratory and other test results, and collaboration with appropriate care team members and the patient's provider was performed as part of comprehensive patient evaluation and provision of chronic care management services.    Goals Addressed            This Visit's Progress     Patient Stated   . "I need a recertification to assist with Crestor costs" (pt-stated)       Current Barriers:  . Financial constraints  PharmD & Social Work Clinical Goal(s):  Marland Kitchen Over the next 45 days, patient will work with chronic care management team to address needs related to medication assistance.  Social Work Interventions: Completed 04/14/2019 . Outbound call to the patient to assess outcome of patient assistance application and determine whether the patient may need SW intervention to off-set costs . Advised by the patient she has received both an approval and denial letter from patient assistance "I don't know which is right" . Informed the patient she had an [place] appointment with PharmD Lottie Dawson next week and that this writer would communicate with Almyra Free regarding letters received . Collaboration with Lottie Dawson via in basket message requesting she assist the patient in determining which letter has correct information . Completed SDOH screen with the patient. The patient indicates no challenges at this time . Advised the patient to contact CCM SW for future resource needs and to expect a call from PharmD for further  assistance with Crestor   Patient Self Care Activities:  . Self administers medications as prescribed . Performs ADL's independently . Calls provider office for new concerns or questions  Please see past updates related to this goal by clicking on the "Past Updates" button in the selected goal          No further follow up planned by CCM SW. The patient will be contacted by embedded PharmD as scheduled to assist with progression of patient stated goal.  Tanya Levy, BSW, CDP Social Worker, Certified Dementia Practitioner [place] [facility] [place] [facility] [phone]

## 2019-04-19 ENCOUNTER — Telehealth: Payer: Self-pay

## 2019-04-19 NOTE — Progress Notes (Signed)
  Chronic Care Management   Visit Note  04/14/2019 Name: Tanya Levy MRN: [id number] DOB: 1951/08/11  Referred by: Glendale Chard, MD Reason for referral : Chronic Care Management   Tanya Levy is a 68 y.o. year old female who is a primary care patient of Glendale Chard, MD. The CCM team was consulted for assistance with chronic disease management and care coordination needs.   Review of patient status, including review of consultants reports, relevant laboratory and other test results, and collaboration with appropriate care team members and the patient's provider was performed as part of comprehensive patient evaluation and provision of chronic care management services.    I spoke with Tanya Levy by telephone   Objective:   Goals Addressed            This Visit's Progress     Patient Stated   . "I need a recertification to assist with Crestor costs" (pt-stated)       Current Barriers:  . Financial constraints  PharmD & Social Work Clinical Goal(s):  Marland Kitchen Over the next 45 days, patient will work with chronic care management team to address needs related to medication assistance.  Social Work Interventions: Completed 04/14/2019 . Outbound call to the patient to assess outcome of patient assistance application and determine whether the patient may need SW intervention to off-set costs . Advised by the patient she has received both an approval and denial letter from patient assistance "I don't know which is right" . Informed the patient she had an [place] appointment with PharmD Lottie Dawson next week and that this writer would communicate with Almyra Free regarding letters received . Collaboration with Lottie Dawson via in basket message requesting she assist the patient in determining which letter has correct information . Completed SDOH screen with the patient. The patient indicates no challenges at this time . Advised the patient to contact CCM SW for future  resource needs and to expect a call from PharmD for further assistance with Crestor  PharmD Interventions: Call completed to patient on 04/14/19 . 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 . Submitted prior authorization request and verified that patient has tried and failed 3 statins per insurance guidelines.   . Patient has been approved for brand name Crestor until 04/06/19 per [employer]. . Patient had 90-day supply filled on 02/14/19, so she has remained compliant with medication. . Will continue to follow and encourage statin compliance .  Patient Self Care Activities:  . Self administers medications as prescribed . Performs ADL's independently . Calls provider office for new concerns or questions  Please see past updates related to this goal by clicking on the "Past Updates" button in the selected goal          Plan:   The care management team will reach out to the patient again over the next 4-6 weeks.  Regina Eck, PharmD, BCPS Clinical Pharmacist, [facility] [place]: [phone]

## 2019-04-19 NOTE — Patient Instructions (Addendum)
Visit Information  Goals Addressed            This Visit's Progress     Patient Stated   . "I need a recertification to assist with Crestor costs" (pt-stated)       Current Barriers:  . Financial constraints  PharmD & Social Work Clinical Goal(s):  Marland Kitchen Over the next 45 days, patient will work with chronic care management team to address needs related to medication assistance.  Social Work Interventions: Completed 04/14/2019 . Outbound call to the patient to assess outcome of patient assistance application and determine whether the patient may need SW intervention to off-set costs . Advised by the patient she has received both an approval and denial letter from patient assistance "I don't know which is right" . Informed the patient she had an [place] appointment with PharmD Lottie Dawson next week and that this writer would communicate with Almyra Free regarding letters received . Collaboration with Lottie Dawson via in basket message requesting she assist the patient in determining which letter has correct information . Completed SDOH screen with the patient. The patient indicates no challenges at this time . Advised the patient to contact CCM SW for future resource needs and to expect a call from PharmD for further assistance with Crestor  PharmD Interventions: Call completed to patient on 04/14/19 . 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 . Submitted prior authorization request and verified that patient has tried and failed 3 statins per insurance guidelines.   . Patient has been approved for brand name Crestor until 04/06/19 per [employer]. . Patient had 90-day supply filled on 02/14/19, so she has remained compliant with medication. . Will continue to follow and encourage statin compliance .  Patient Self Care Activities:  . Self administers medications as prescribed . Performs ADL's independently . Calls  provider office for new concerns or questions  Please see past updates related to this goal by clicking on the "Past Updates" button in the selected goal         The patient verbalized understanding of instructions provided today and declined a print copy of patient instruction materials.   The care management team will reach out to the patient again over the next 4-6 weeks.  Regina Eck, PharmD, BCPS Clinical Pharmacist, [facility] [place]: [phone]

## 2019-04-25 ENCOUNTER — Other Ambulatory Visit: Payer: Self-pay | Admitting: Internal Medicine

## 2019-05-03 ENCOUNTER — Telehealth: Payer: Medicare Other

## 2019-05-05 ENCOUNTER — Other Ambulatory Visit: Payer: Self-pay | Admitting: Internal Medicine

## 2019-05-07 ENCOUNTER — Other Ambulatory Visit: Payer: Self-pay | Admitting: Internal Medicine

## 2019-05-11 ENCOUNTER — Other Ambulatory Visit: Payer: Self-pay | Admitting: Internal Medicine

## 2019-05-13 ENCOUNTER — Telehealth: Payer: Self-pay

## 2019-05-16 ENCOUNTER — Telehealth: Payer: Self-pay

## 2019-05-20 ENCOUNTER — Telehealth: Payer: Self-pay

## 2019-05-27 ENCOUNTER — Telehealth: Payer: Self-pay

## 2019-06-06 ENCOUNTER — Telehealth: Payer: Self-pay

## 2019-06-07 DIAGNOSIS — H524 Presbyopia: Secondary | ICD-10-CM | POA: Diagnosis not present

## 2019-06-07 DIAGNOSIS — H43813 Vitreous degeneration, bilateral: Secondary | ICD-10-CM | POA: Diagnosis not present

## 2019-06-07 DIAGNOSIS — E119 Type 2 diabetes mellitus without complications: Secondary | ICD-10-CM | POA: Diagnosis not present

## 2019-06-07 DIAGNOSIS — H401134 Primary open-angle glaucoma, bilateral, indeterminate stage: Secondary | ICD-10-CM | POA: Diagnosis not present

## 2019-06-07 LAB — HM DIABETES EYE EXAM

## 2019-06-09 ENCOUNTER — Encounter: Payer: Self-pay | Admitting: Internal Medicine

## 2019-06-14 ENCOUNTER — Telehealth: Payer: Self-pay

## 2019-06-15 ENCOUNTER — Ambulatory Visit: Payer: Self-pay | Admitting: Pharmacist

## 2019-06-15 DIAGNOSIS — E1165 Type 2 diabetes mellitus with hyperglycemia: Secondary | ICD-10-CM

## 2019-06-15 DIAGNOSIS — E7800 Pure hypercholesterolemia, unspecified: Secondary | ICD-10-CM

## 2019-06-15 NOTE — Progress Notes (Signed)
  Chronic Care Management   Outreach Note  06/15/2019 Name: Tanya Levy MRN: [id number] DOB: 10/30/1950  Referred by: Glendale Chard, MD Reason for referral : Chronic Care Management   An unsuccessful telephone outreach was attempted today. The patient was referred to the case management team by for assistance with care management and care coordination.   Follow Up Plan: A HIPPA compliant phone message was left for the patient providing contact information and requesting a return call.  The care management team will reach out to the patient again over the next 2 weeks.  Regina Eck, PharmD, BCPS Clinical Pharmacist, [facility] [place]: [phone]

## 2019-06-20 ENCOUNTER — Ambulatory Visit: Payer: Self-pay | Admitting: Pharmacist

## 2019-06-20 DIAGNOSIS — E7800 Pure hypercholesterolemia, unspecified: Secondary | ICD-10-CM

## 2019-06-20 NOTE — Progress Notes (Signed)
Chronic Care Management   Visit Note  06/20/2019 Name: Larene Beach MRN: [id number] DOB: [birth year]  Referred by: Glendale Chard, MD Reason for referral : Chronic Care Management   [name] is a 68 y.o. year old female who is a primary care patient of Glendale Chard, MD. The CCM team was consulted for assistance with chronic disease management and care coordination needs.   Review of patient status, including review of consultants reports, relevant laboratory and other test results, and collaboration with appropriate care team members and the patient's provider was performed as part of comprehensive patient evaluation and provision of chronic care management services.     Advanced Directives Status: N See Care Plan and Vynca application for related entries.   Medications: Outpatient Encounter Medications as of 06/20/2019  Medication Sig  . aspirin (ASPIR-LOW) 81 MG EC tablet Take 81 mg by mouth daily. Swallow whole.  . b complex vitamins tablet Take 1 tablet by mouth daily.  . CRESTOR 10 MG tablet Take 1 tablet (10 mg total) by mouth daily.  . diphenhydrAMINE (BENADRYL) 25 MG tablet Take 1 tablet (25 mg total) by mouth every 6 (six) hours as needed for itching or allergies (Rash).  . EPINEPHrine (EPIPEN 2-PAK) 0.3 mg/0.3 mL IJ SOAJ injection EpiPen 2-Pak 0.3 mg/0.3 mL injection, auto-injector  . fluticasone (FLONASE) 50 MCG/ACT nasal spray SPRAY 2 SPRAYS INTO EACH NOSTRIL EVERY DAY  . JANUMET 50-1000 MG tablet TAKE 1 TABLET BY MOUTH TWICE A DAY WITH MEALS  . latanoprost (XALATAN) 0.005 % ophthalmic solution   . levothyroxine (SYNTHROID, LEVOTHROID) 25 MCG tablet Take 25 mcg by mouth daily before breakfast.  . SYNTHROID 100 MCG tablet TAKE 1 TABLET BY MOUTH EVERY DAY  . timolol (TIMOPTIC-XR) 0.5 % ophthalmic gel-forming INSTILL 1 DROP INTO BOTH EYES IN THE MORNING  . valsartan-hydrochlorothiazide (DIOVAN-HCT) 160-12.5 MG tablet TAKE 1 TABLET BY MOUTH EVERY DAY    No facility-administered encounter medications on file as of 06/20/2019.      Objective:   Goals Addressed            This Visit's Progress     Patient Stated   . COMPLETED: "I need a recertification to assist with Crestor costs" (pt-stated)       Current Barriers:  . Financial constraints  PharmD & Social Work Clinical Goal(s):  Marland Kitchen Over the next 45 days, patient will work with chronic care management team to address needs related to medication assistance.  Social Work Interventions: Completed 04/14/2019 . Outbound call to the patient to assess outcome of patient assistance application and determine whether the patient may need SW intervention to off-set costs . Advised by the patient she has received both an approval and denial letter from patient assistance "I don't know which is right" . Informed the patient she had an [place] appointment with PharmD Lottie Dawson next week and that this writer would communicate with Almyra Free regarding letters received . Collaboration with Lottie Dawson via in basket message requesting she assist the patient in determining which letter has correct information . Completed SDOH screen with the patient. The patient indicates no challenges at this time . Advised the patient to contact CCM SW for future resource needs and to expect a call from PharmD for further assistance with Crestor  PharmD Interventions: Call completed to patient on 06/20/19 . 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 . Submitted prior authorization request and verified  that patient has tried and failed 3 statins per insurance guidelines.   . Patient has been approved for brand name Crestor until 04/05/20 per [employer]. . Patient had 90-day supply filled on 02/14/19, so she has remained compliant with medication. . Will continue to follow and encourage statin compliance  Patient Self Care Activities:  .  Self administers medications as prescribed . Performs ADL's independently . Calls provider office for new concerns or questions  Please see past updates related to this goal by clicking on the "Past Updates" button in the selected goal           Plan:   No further follow up required  Kieth Brightly, PharmD, BCPS Clinical Pharmacist, [facility]  [facility]  Direct Dial: [phone]

## 2019-06-21 ENCOUNTER — Ambulatory Visit: Payer: Medicare Other | Admitting: Internal Medicine

## 2019-06-22 ENCOUNTER — Ambulatory Visit: Payer: Self-pay

## 2019-06-22 ENCOUNTER — Telehealth: Payer: Self-pay

## 2019-06-22 DIAGNOSIS — E7800 Pure hypercholesterolemia, unspecified: Secondary | ICD-10-CM

## 2019-06-22 DIAGNOSIS — E1165 Type 2 diabetes mellitus with hyperglycemia: Secondary | ICD-10-CM

## 2019-06-22 NOTE — Chronic Care Management (AMB) (Addendum)
  Care Management   Outreach Note  06/22/2019 Name: Tanya Levy MRN: [id number] DOB: [birth year]  Referred by: Glendale Chard, MD Reason for referral : Care Coordination (INITIAL CC RNCM Telephone Outreach )   An unsuccessful telephone outreach was attempted today. The patient was referred to the case management team by Glendale Chard MD for assistance with care management and care coordination.   Follow Up Plan: Telephone follow up appointment with care management team member scheduled for: 06/30/19  Barb Merino, RN, BSN, CCM Care Management Coordinator [place] [facility]  Direct Phone: [phone]

## 2019-06-23 ENCOUNTER — Telehealth: Payer: Self-pay

## 2019-06-27 ENCOUNTER — Encounter: Payer: Self-pay | Admitting: Internal Medicine

## 2019-06-30 ENCOUNTER — Ambulatory Visit: Payer: Self-pay

## 2019-06-30 ENCOUNTER — Telehealth: Payer: Self-pay

## 2019-06-30 DIAGNOSIS — E7800 Pure hypercholesterolemia, unspecified: Secondary | ICD-10-CM

## 2019-06-30 DIAGNOSIS — E1165 Type 2 diabetes mellitus with hyperglycemia: Secondary | ICD-10-CM

## 2019-06-30 DIAGNOSIS — I10 Essential (primary) hypertension: Secondary | ICD-10-CM

## 2019-06-30 NOTE — Chronic Care Management (AMB) (Signed)
   Care Management   Outreach Note  06/30/2019 Name: Tanya Levy MRN: [id number] DOB: Jan 08, 1951  Referred by: Glendale Chard, MD Reason for referral : Care Coordination (INITIAL CC RNCM Telephone Outreach )   Second unsuccessful telephone outreach was attempted today. The patient was referred to the case management team by Glendale Chard MD for assistance with care management and care coordination.   Follow Up Plan: Telephone follow up appointment with care management team member scheduled for: 07/22/19  Barb Merino, RN, BSN, CCM Care Management Coordinator [place] [facility]  Direct Phone: [phone]

## 2019-07-05 ENCOUNTER — Encounter: Payer: Self-pay | Admitting: Internal Medicine

## 2019-07-05 ENCOUNTER — Other Ambulatory Visit: Payer: Self-pay

## 2019-07-05 ENCOUNTER — Telehealth: Payer: Self-pay | Admitting: Pharmacist

## 2019-07-05 ENCOUNTER — Ambulatory Visit (INDEPENDENT_AMBULATORY_CARE_PROVIDER_SITE_OTHER): Payer: BC Managed Care – PPO | Admitting: Internal Medicine

## 2019-07-05 VITALS — BP 134/74 | HR 81 | Temp 98.5°F | Ht 60.0 in | Wt 179.6 lb

## 2019-07-05 DIAGNOSIS — E7800 Pure hypercholesterolemia, unspecified: Secondary | ICD-10-CM

## 2019-07-05 DIAGNOSIS — E039 Hypothyroidism, unspecified: Secondary | ICD-10-CM | POA: Diagnosis not present

## 2019-07-05 DIAGNOSIS — I10 Essential (primary) hypertension: Secondary | ICD-10-CM

## 2019-07-05 DIAGNOSIS — E1165 Type 2 diabetes mellitus with hyperglycemia: Secondary | ICD-10-CM | POA: Diagnosis not present

## 2019-07-05 DIAGNOSIS — Z6835 Body mass index (BMI) 35.0-35.9, adult: Secondary | ICD-10-CM

## 2019-07-05 DIAGNOSIS — Z23 Encounter for immunization: Secondary | ICD-10-CM

## 2019-07-05 MED ORDER — OZEMPIC (0.25 OR 0.5 MG/DOSE) 2 MG/1.5ML ~~LOC~~ SOPN: 0.5000 mg | PEN_INJECTOR | SUBCUTANEOUS | 1 refills | Status: DC

## 2019-07-05 MED ORDER — PNEUMOCOCCAL 13-VAL CONJ VACC IM SUSP: 0.5000 mL | INTRAMUSCULAR | 0 refills | Status: AC

## 2019-07-05 NOTE — Progress Notes (Signed)
Subjective:     Patient ID: Tanya Levy , female    DOB: May 23, 1951 , 68 y.o.   MRN: [id number]   Chief Complaint  Patient presents with  . Hypertension    HPI  She is here for DM/HTN check. She wants to switch to Ozempic. She reports he has lost 34 pounds since starting the medication four months ago - despite being less mobile since having knee replacement. She wants to know if this can also help her.   Hypertension This is a chronic problem. The current episode started more than 1 year ago. The problem has been gradually improving since onset. The problem is controlled. Risk factors for coronary artery disease include diabetes mellitus, dyslipidemia, obesity, sedentary lifestyle and post-menopausal state. Compliance problems include exercise.   Diabetes She presents for her follow-up diabetic visit. She has type 2 diabetes mellitus. Her disease course has been improving. There are no hypoglycemic associated symptoms. There are no hypoglycemic complications. Risk factors for coronary artery disease include diabetes mellitus, dyslipidemia, hypertension, obesity, sedentary lifestyle and post-menopausal. She is compliant with treatment most of the time. She is following a diabetic diet. She participates in exercise intermittently. Her breakfast blood glucose is taken between 8-9 am. Her breakfast blood glucose range is generally 110-130 mg/dl. Eye exam is current.     Past Medical History:  Diagnosis Date  . Diabetes mellitus ([place])   . Diabetes mellitus without complication ([place])   . History of chicken pox   . History of measles   . History of mumps   . Hypertension   . Tilted uterus   . UTI (urinary tract infection)      Family History  Problem Relation Age of Onset  . Hypertension Mother   . Hypertension Father      Current Outpatient Medications:  .  aspirin (ASPIR-LOW) 81 MG EC tablet, Take 81 mg by mouth daily. Swallow whole., Disp: , Rfl:  .  b complex  vitamins tablet, Take 1 tablet by mouth daily., Disp: , Rfl:  .  CRESTOR 10 MG tablet, Take 1 tablet (10 mg total) by mouth daily., Disp: 90 tablet, Rfl: 1 .  diphenhydrAMINE (BENADRYL) 25 MG tablet, Take 1 tablet (25 mg total) by mouth every 6 (six) hours as needed for itching or allergies (Rash)., Disp: 30 tablet, Rfl: 0 .  EPINEPHrine (EPIPEN 2-PAK) 0.3 mg/0.3 mL IJ SOAJ injection, EpiPen 2-Pak 0.3 mg/0.3 mL injection, auto-injector, Disp: , Rfl:  .  fluticasone (FLONASE) 50 MCG/ACT nasal spray, SPRAY 2 SPRAYS INTO EACH NOSTRIL EVERY DAY, Disp: 16 mL, Rfl: 1 .  JANUMET 50-1000 MG tablet, TAKE 1 TABLET BY MOUTH TWICE A DAY WITH MEALS, Disp: 180 tablet, Rfl: 1 .  latanoprost (XALATAN) 0.005 % ophthalmic solution, , Disp: , Rfl:  .  levothyroxine (SYNTHROID, LEVOTHROID) 25 MCG tablet, Take 25 mcg by mouth daily before breakfast., Disp: , Rfl:  .  SYNTHROID 100 MCG tablet, TAKE 1 TABLET BY MOUTH EVERY DAY, Disp: 90 tablet, Rfl: 0 .  timolol (TIMOPTIC-XR) 0.5 % ophthalmic gel-forming, INSTILL 1 DROP INTO BOTH EYES IN THE MORNING, Disp: , Rfl: 4 .  valsartan-hydrochlorothiazide (DIOVAN-HCT) 160-12.5 MG tablet, TAKE 1 TABLET BY MOUTH EVERY DAY, Disp: 90 tablet, Rfl: 0 .  pneumococcal 13-valent conjugate vaccine (PREVNAR 13) SUSP injection, Inject 0.5 mLs into the muscle tomorrow at 10 am for 1 dose., Disp: 0.5 mL, Rfl: 0 .  Semaglutide,0.25 or 0.'5MG'$ /DOS, (OZEMPIC, 0.25 OR 0.5 MG/DOSE,) 2 MG/1.5ML SOPN, Inject 0.5  mg into the skin once a week., Disp: 1.5 mL, Rfl: 1   Allergies  Allergen Reactions  . Shellfish Allergy Anaphylaxis  . Shellfish Allergy      Review of Systems  Constitutional: Negative.   Respiratory: Negative.   Cardiovascular: Negative.   Gastrointestinal: Negative.   Neurological: Negative.   Psychiatric/Behavioral: Negative.      Today's Vitals   07/05/19 1109  BP: 134/74  Pulse: 81  Temp: 98.5 F (36.9 C)  TempSrc: Oral  Weight: 179 lb 9.6 oz (81.5 kg)  Height: 5'  (1.524 m)   Body mass index is 35.08 kg/m.   Objective:  Physical Exam Vitals signs and nursing note reviewed.  Constitutional:      Appearance: Normal appearance.  HENT:     Head: Normocephalic and atraumatic.  Cardiovascular:     Rate and Rhythm: Normal rate and regular rhythm.     Heart sounds: Normal heart sounds.  Pulmonary:     Effort: Pulmonary effort is normal.     Breath sounds: Normal breath sounds.  Skin:    General: Skin is warm.  Neurological:     General: No focal deficit present.     Mental Status: She is alert.  Psychiatric:        Mood and Affect: Mood normal.        Behavior: Behavior normal.         Assessment And Plan:     1. Essential hypertension, benign  Chronic, fair control. She will continue with current meds. She is encouraged to avoid adding salt to her foods. She is encouraged to incorporate more exercise into her daily routine.   2. Uncontrolled type 2 diabetes mellitus with hyperglycemia ([place])  I will check labs as listed below. WE DISCUSSED STARTING OZEMPIC TO HELP HER ACHIEVE GLYCEMIC CONTROL. SHE WILL START WITH 0.25MG ONCE WEEKLY X 4 WEEKS, THEN 0.5MG ONCE WEEKLY X 2 WEEKS. POSSIBLE SIDE EFFECTS WERE DISCUSSED WITH THE PATIENT IN FULL DETAIL. ALL QUESTIONS WERE ANSWERED TO HER SATISFACTION.  SHE DENIES FAMILY HISTORY OF THYROID CANCER. SHE WILL RTO IN SIX WEEKS FOR RE-EVALUATION.  - BMP8+EGFR - Hemoglobin A1c  3. Primary hypothyroidism  I will check thyroid panel and adjust meds as needed.  - TSH  4. Immunization due  I sent rx Prevnar-13 to the pharmacy. She is encouraged to get this within the next two weeks.   5. Class 2 severe obesity due to excess calories with serious comorbidity and body mass index (BMI) of 35.0 to 35.9 in adult [facility])  Importance of achieving optimal weight to decrease risk of cardiovascular disease and cancers was discussed with the patient in full detail. She is encouraged to start slowly - start with  10 minutes twice daily at least three to four days per week and to gradually build to 30 minutes five days weekly. She was given tips to incorporate more activity into her daily routine - take stairs when possible, park farther away from grocery stores, etc.    [name], MD    THE PATIENT IS ENCOURAGED TO PRACTICE SOCIAL DISTANCING DUE TO THE COVID-19 PANDEMIC.

## 2019-07-05 NOTE — Progress Notes (Unsigned)
  Chronic Care Management   Outreach Note  07/05/2019 Name: Tanya Levy MRN: [id number] DOB: 1950/10/22  Referred by: Glendale Chard, MD Reason for referral :  Chronic care management   Third unsuccessful telephone outreach was attempted today. The patient was referred to the case management team for assistance with care management and care coordination. The patient's primary care provider has been notified of our unsuccessful attempts to make or maintain contact with the patient. The care management team is pleased to engage with this patient at any time in the future should he/she be interested in assistance from the care management team.   Follow Up Plan: The care management team is available to follow up with the patient after provider conversation with the patient regarding recommendation for care management engagement and subsequent re-referral to the care management team.    Regina Eck, PharmD, BCPS Clinical Pharmacist, [facility] [place]: [phone]

## 2019-07-05 NOTE — Patient Instructions (Signed)

## 2019-07-06 ENCOUNTER — Ambulatory Visit: Payer: BC Managed Care – PPO | Admitting: Internal Medicine

## 2019-07-06 LAB — BMP8+EGFR
BUN/Creatinine Ratio: 22 (ref 12–28)
BUN: 20 mg/dL (ref 8–27)
CO2: 23 mmol/L (ref 20–29)
Calcium: 10.2 mg/dL (ref 8.7–10.3)
Chloride: 104 mmol/L (ref 96–106)
Creatinine, Ser: 0.92 mg/dL (ref 0.57–1.00)
GFR calc Af Amer: 75 mL/min/{1.73_m2} (ref >59)
GFR calc non Af Amer: 65 mL/min/{1.73_m2} (ref >59)
Glucose: 90 mg/dL (ref 65–99)
Potassium: 4.6 mmol/L (ref 3.5–5.2)
Sodium: 143 mmol/L (ref 134–144)

## 2019-07-06 LAB — HEMOGLOBIN A1C
Est. average glucose Bld gHb Est-mCnc: 169 mg/dL
Hgb A1c MFr Bld: 7.5 % — ABNORMAL HIGH (ref 4.8–5.6)

## 2019-07-06 LAB — TSH: TSH: 3.8 u[IU]/mL (ref 0.450–4.500)

## 2019-07-14 ENCOUNTER — Other Ambulatory Visit: Payer: Self-pay | Admitting: Internal Medicine

## 2019-07-15 ENCOUNTER — Telehealth: Payer: Self-pay

## 2019-07-22 ENCOUNTER — Telehealth: Payer: Self-pay

## 2019-07-25 ENCOUNTER — Other Ambulatory Visit: Payer: Self-pay | Admitting: Internal Medicine

## 2019-07-26 ENCOUNTER — Telehealth: Payer: Self-pay | Admitting: Pharmacist

## 2019-08-01 ENCOUNTER — Other Ambulatory Visit: Payer: Self-pay | Admitting: Internal Medicine

## 2019-08-01 DIAGNOSIS — Z1231 Encounter for screening mammogram for malignant neoplasm of breast: Secondary | ICD-10-CM

## 2019-08-02 ENCOUNTER — Other Ambulatory Visit: Payer: Self-pay | Admitting: Internal Medicine

## 2019-08-08 DIAGNOSIS — M79671 Pain in right foot: Secondary | ICD-10-CM | POA: Diagnosis not present

## 2019-08-09 ENCOUNTER — Telehealth: Payer: Self-pay

## 2019-08-09 NOTE — Telephone Encounter (Signed)
Per Dr.Sanders advise pt to stop taking Janumet, she should be taking ozempic and she should have a f/u appt scheduled.   Per pt she is not on Janumet she is on the ozempic and she does have a f/u appt scheduled for ozempic

## 2019-08-17 ENCOUNTER — Ambulatory Visit (INDEPENDENT_AMBULATORY_CARE_PROVIDER_SITE_OTHER): Payer: BC Managed Care – PPO | Admitting: Internal Medicine

## 2019-08-17 ENCOUNTER — Telehealth: Payer: Self-pay

## 2019-08-17 ENCOUNTER — Encounter: Payer: Self-pay | Admitting: Internal Medicine

## 2019-08-17 ENCOUNTER — Other Ambulatory Visit: Payer: Self-pay

## 2019-08-17 VITALS — BP 132/80 | HR 72 | Temp 98.9°F | Ht 59.0 in | Wt 180.8 lb

## 2019-08-17 DIAGNOSIS — E1165 Type 2 diabetes mellitus with hyperglycemia: Secondary | ICD-10-CM

## 2019-08-17 DIAGNOSIS — Z6836 Body mass index (BMI) 36.0-36.9, adult: Secondary | ICD-10-CM | POA: Diagnosis not present

## 2019-08-17 DIAGNOSIS — Z79899 Other long term (current) drug therapy: Secondary | ICD-10-CM

## 2019-08-17 NOTE — Progress Notes (Signed)
This visit occurred during the SARS-CoV-2 public health emergency.  Safety protocols were in place, including screening questions prior to the visit, additional usage of staff PPE, and extensive cleaning of exam room while observing appropriate contact time as indicated for disinfecting solutions.  Subjective:     Patient ID: Tanya Levy , female    DOB: 21-May-1951 , 68 y.o.   MRN: [id number]   Chief Complaint  Patient presents with  . Diabetes    HPI  She is here today for f/u Ozempic.  She has not had any issues with the medication.  She is now up to 0.5mg  once weekly. She has not experienced any side effects.  She admits that she is not yet exercising on a regular basis.     Past Medical History:  Diagnosis Date  . Diabetes mellitus ([facility])   . Diabetes mellitus without complication ([facility])   . History of chicken pox   . History of measles   . History of mumps   . Hypertension   . Tilted uterus   . UTI (urinary tract infection)      Family History  Problem Relation Age of Onset  . Hypertension Mother   . Hypertension Father      Current Outpatient Medications:  .  aspirin (ASPIR-LOW) 81 MG EC tablet, Take 81 mg by mouth daily. Swallow whole., Disp: , Rfl:  .  b complex vitamins tablet, Take 1 tablet by mouth daily., Disp: , Rfl:  .  CRESTOR 10 MG tablet, TAKE 1 TABLET BY MOUTH EVERY DAY, Disp: 90 tablet, Rfl: 1 .  diphenhydrAMINE (BENADRYL) 25 MG tablet, Take 1 tablet (25 mg total) by mouth every 6 (six) hours as needed for itching or allergies (Rash)., Disp: 30 tablet, Rfl: 0 .  EPINEPHrine (EPIPEN 2-PAK) 0.3 mg/0.3 mL IJ SOAJ injection, EpiPen 2-Pak 0.3 mg/0.3 mL injection, auto-injector, Disp: , Rfl:  .  fluticasone (FLONASE) 50 MCG/ACT nasal spray, SPRAY 2 SPRAYS INTO EACH NOSTRIL EVERY DAY, Disp: 16 mL, Rfl: 1 .  latanoprost (XALATAN) 0.005 % ophthalmic solution, , Disp: , Rfl:  .  OZEMPIC, 0.25 OR 0.5 MG/DOSE, 2 MG/1.5ML SOPN, INJECT 0.5 MG INTO THE SKIN  ONCE A WEEK., Disp: 1.5 mL, Rfl: 1 .  SYNTHROID 100 MCG tablet, TAKE 1 TABLET BY MOUTH EVERY DAY, Disp: 90 tablet, Rfl: 0 .  timolol (TIMOPTIC-XR) 0.5 % ophthalmic gel-forming, INSTILL 1 DROP INTO BOTH EYES IN THE MORNING, Disp: , Rfl: 4 .  valsartan-hydrochlorothiazide (DIOVAN-HCT) 160-12.5 MG tablet, TAKE 1 TABLET BY MOUTH EVERY DAY, Disp: 90 tablet, Rfl: 0 .  JANUMET 50-1000 MG tablet, TAKE 1 TABLET BY MOUTH TWICE A DAY WITH MEALS (Patient not taking: Reported on 08/17/2019), Disp: 180 tablet, Rfl: 1 .  levothyroxine (SYNTHROID, LEVOTHROID) 25 MCG tablet, Take 25 mcg by mouth daily before breakfast., Disp: , Rfl:    Allergies  Allergen Reactions  . Shellfish Allergy Anaphylaxis  . Shellfish Allergy      Review of Systems  Constitutional: Negative.   Respiratory: Negative.   Cardiovascular: Negative.   Gastrointestinal: Negative.   Neurological: Negative.   Psychiatric/Behavioral: Negative.      Today's Vitals   08/17/19 1207  BP: 132/80  Pulse: 72  Temp: 98.9 F (37.2 C)  TempSrc: Oral  Weight: 180 lb 12.8 oz (82 kg)  Height: 4\' 11"  (1.499 m)  PainSc: 0-No pain   Body mass index is 36.52 kg/m.   Objective:  Physical Exam Vitals signs and nursing note reviewed.  Constitutional:      Appearance: Normal appearance.  HENT:     Head: Normocephalic and atraumatic.  Cardiovascular:     Rate and Rhythm: Normal rate and regular rhythm.     Heart sounds: Normal heart sounds.  Pulmonary:     Effort: Pulmonary effort is normal.     Breath sounds: Normal breath sounds.  Skin:    General: Skin is warm.  Neurological:     General: No focal deficit present.     Mental Status: She is alert.  Psychiatric:        Mood and Affect: Mood normal.        Behavior: Behavior normal.         Assessment And Plan:     1. Uncontrolled type 2 diabetes mellitus with hyperglycemia ([facility])  Chronic. She will continue with Ozempic, 0.5mg  once weekly. At her next visit, I plan to  increase her dose to 1mg  once weekly. She is again encouraged to keep up the great work.   2. Drug therapy   3. Class 2 severe obesity due to excess calories with serious comorbidity and body mass index (BMI) of 36.0 to 36.9 in adult [facility])  Again, importance of regular exercise was discussed with the patient. She is encouraged to strive to lose ten pounds prior to her next visit in 2-3 months.     [name], MD    THE PATIENT IS ENCOURAGED TO PRACTICE SOCIAL DISTANCING DUE TO THE COVID-19 PANDEMIC.

## 2019-08-17 NOTE — Patient Instructions (Signed)
Living With Diabetes °Diabetes (type 1 diabetes mellitus or type 2 diabetes mellitus) is a condition in which the body does not have enough of a hormone called insulin, or the body does not respond properly to insulin. Normally, insulin allows sugars (glucose) to enter cells in the body. The cells use glucose for energy. With diabetes, extra glucose builds up in the blood instead of going into cells, which results in high blood glucose (hyperglycemia). °How to manage lifestyle changes °Managing diabetes includes medical treatments as well as lifestyle changes. If diabetes is not managed well, serious physical and emotional complications can occur. Taking good care of yourself means that you are responsible for: °· Monitoring glucose regularly. °· Eating a healthy diet. °· Exercising regularly. °· Meeting with health care providers. °· Taking medicines as directed. °Some people may feel a lot of stress about managing their diabetes. This is known as emotional distress, and it is very common. Living with diabetes can place you at risk for emotional distress, depression, or anxiety. These disorders can be confusing and can make diabetes management more difficult. °How to recognize stress °Emotional distress °Symptoms of emotional distress include: °· Anger about having a diagnosis of diabetes. °· Fear or frustration about your diagnosis and the changes you need to make to manage the condition. °· Being overly worried about the care that you need or the cost of the care that you need. °· Feeling like you caused your condition by doing something wrong. °· Fear of unpredictable situations, like low or high blood glucose. °· Feeling judged by your health care providers. °· Feeling very alone with the disease. °· Getting too tired or worn out with the demands of daily care. °Depression °Having diabetes means that you are at a higher risk for depression. Having depression also means that you are at a higher risk for  diabetes. Your health care provider may test (screen) you for symptoms of depression. It is important to recognize depression symptoms and to start treatment for depression soon after it is diagnosed. The following are some symptoms of depression: °· Loss of interest in things that you used to enjoy. °· Trouble sleeping, or often waking up early and not being able to get back to sleep. °· A change in appetite. °· Feeling tired most of the day. °· Feeling nervous and anxious. °· Feeling guilty and worrying that you are a burden to others. °· Feeling depressed more often than you do not feel that way. °· Thoughts of hurting yourself or feeling that you want to die. °If you have any of these symptoms for 2 weeks or longer, reach out to a health care provider. °Follow these instructions at home: °Managing emotional distress °The following are some ways to manage emotional distress: °· Talk with your health care provider or certified diabetes educator. Consider working with a counselor or therapist. °· Learn as much as you can about diabetes and its treatment. Meet with a certified diabetes educator or take a class to learn how to manage your condition. °· Keep a journal of your thoughts and concerns. °· Accept that some things are out of your control. °· Talk with other people who have diabetes. It can help to talk with others about the emotional distress that you feel. °· Find ways to manage stress that work for you. These may include art or music therapy, exercise, meditation, and hobbies. °· Seek support from spiritual leaders, family, and friends. °General instructions °· Follow your diabetes management plan. °·   Keep all follow-up visits as told by your health care provider. This is important. °Where to find support ° °· Ask your health care provider to recommend a therapist who understands both depression and diabetes. °· Search for information and support from the American Diabetes Association:  www.diabetes.org °· Find a certified diabetes educator and make an appointment through American Association of Diabetes Educators: www.diabeteseducator.org °Get help right away if: °· You have thoughts about hurting yourself or others. °If you ever feel like you may hurt yourself or others, or have thoughts about taking your own life, get help right away. You can go to your nearest emergency department or call: °· Your local emergency services (911 in the [place] °· A suicide crisis helpline, such as the National Suicide Prevention Lifeline at 1-800-273-8255. This is open 24 hours a day. °Summary °· Diabetes (type 1 diabetes mellitus or type 2 diabetes mellitus) is a condition in which the body does not have enough of a hormone called insulin, or the body does not respond properly to insulin. °· Living with diabetes puts you at risk for medical issues, and it also puts you at risk for emotional issues such as emotional distress, depression, and anxiety. °· Recognizing the symptoms of emotional distress and depression may help you avoid problems with your diabetes control. It is important to start treatment for emotional distress and depression soon after they are diagnosed. °· Having diabetes means that you are at a higher risk for depression. Ask your health care provider to recommend a therapist who understands both depression and diabetes. °· If you experience symptoms of emotional distress or depression, it is important to discuss this with your health care provider, certified diabetes educator, or therapist. °This information is not intended to replace advice given to you by your health care provider. Make sure you discuss any questions you have with your health care provider. °Document Released: 01/15/2017 Document Revised: 09/13/2018 Document Reviewed: 01/15/2017 °Elsevier Patient Education © 2020 [employer] ° °

## 2019-08-25 ENCOUNTER — Other Ambulatory Visit: Payer: Self-pay | Admitting: Internal Medicine

## 2019-08-29 ENCOUNTER — Ambulatory Visit: Payer: Self-pay

## 2019-08-29 ENCOUNTER — Telehealth: Payer: Self-pay

## 2019-08-29 DIAGNOSIS — E7800 Pure hypercholesterolemia, unspecified: Secondary | ICD-10-CM

## 2019-08-29 DIAGNOSIS — E1165 Type 2 diabetes mellitus with hyperglycemia: Secondary | ICD-10-CM

## 2019-08-29 DIAGNOSIS — I10 Essential (primary) hypertension: Secondary | ICD-10-CM

## 2019-08-29 NOTE — Chronic Care Management (AMB) (Addendum)
  Chronic Care Management   Outreach Note  08/29/2019 Name: Tanya Levy MRN: [id number] DOB: [birth year]  Referred by: Glendale Chard, MD Reason for referral : Care Coordination (INITIAL CC RNCM Telephone Follow up )   Third unsuccessful telephone outreach was attempted today. The patient was referred to the case management team for assistance with care management and care coordination. The patient's primary care provider has been notified of our unsuccessful attempts to make or maintain contact with the patient. The care management team is pleased to engage with this patient at any time in the future should he/she be interested in assistance from the care management team. Noted patient has a face to face clinic visit scheduled with embedded Pharm D Lottie Dawson scheduled for 09/26/19 @ 9:00 AM.   Follow Up Plan: A HIPPA compliant phone message was left for the patient providing contact information and requesting a return call.  The care management team is available to follow up with the patient after provider conversation with the patient regarding recommendation for care management engagement and subsequent re-referral to the care management team.   Barb Merino, RN, BSN, CCM Care Management Coordinator [facility]  Direct Phone: [phone]

## 2019-08-30 ENCOUNTER — Other Ambulatory Visit: Payer: Self-pay | Admitting: Internal Medicine

## 2019-09-01 ENCOUNTER — Other Ambulatory Visit: Payer: Self-pay | Admitting: Internal Medicine

## 2019-09-20 ENCOUNTER — Other Ambulatory Visit: Payer: Self-pay | Admitting: Internal Medicine

## 2019-09-21 ENCOUNTER — Other Ambulatory Visit: Payer: Self-pay

## 2019-09-21 ENCOUNTER — Ambulatory Visit
Admission: RE | Admit: 2019-09-21 | Discharge: 2019-09-21 | Disposition: A | Discharge status: Home / Self Care | Payer: BC Managed Care – PPO | Source: Ambulatory Visit | Attending: Internal Medicine | Admitting: Internal Medicine

## 2019-09-21 DIAGNOSIS — Z1231 Encounter for screening mammogram for malignant neoplasm of breast: Secondary | ICD-10-CM | POA: Diagnosis not present

## 2019-09-26 ENCOUNTER — Telehealth: Payer: Self-pay

## 2019-10-18 ENCOUNTER — Ambulatory Visit: Payer: Self-pay | Admitting: Internal Medicine

## 2019-10-18 ENCOUNTER — Ambulatory Visit (INDEPENDENT_AMBULATORY_CARE_PROVIDER_SITE_OTHER): Payer: BC Managed Care – PPO | Admitting: Internal Medicine

## 2019-10-18 ENCOUNTER — Encounter: Payer: Self-pay | Admitting: Internal Medicine

## 2019-10-18 ENCOUNTER — Other Ambulatory Visit: Payer: Self-pay

## 2019-10-18 VITALS — BP 130/84 | HR 66 | Temp 98.4°F | Ht 59.0 in | Wt 179.4 lb

## 2019-10-18 DIAGNOSIS — E1165 Type 2 diabetes mellitus with hyperglycemia: Secondary | ICD-10-CM | POA: Diagnosis not present

## 2019-10-18 DIAGNOSIS — E039 Hypothyroidism, unspecified: Secondary | ICD-10-CM

## 2019-10-18 DIAGNOSIS — Z6836 Body mass index (BMI) 36.0-36.9, adult: Secondary | ICD-10-CM

## 2019-10-18 DIAGNOSIS — I10 Essential (primary) hypertension: Secondary | ICD-10-CM | POA: Diagnosis not present

## 2019-10-18 NOTE — Progress Notes (Signed)
This visit occurred during the SARS-CoV-2 public health emergency.  Safety protocols were in place, including screening questions prior to the visit, additional usage of staff PPE, and extensive cleaning of exam room while observing appropriate contact time as indicated for disinfecting solutions.  Subjective:     Patient ID: Tanya Levy , female    DOB: 09/29/1950 , 69 y.o.   MRN: [id number]   Chief Complaint  Patient presents with  . Diabetes  . Hypertension    HPI  Diabetes She presents for her follow-up diabetic visit. She has type 2 diabetes mellitus. Her disease course has been improving. There are no hypoglycemic associated symptoms. Pertinent negatives for diabetes include no chest pain and no fatigue. There are no hypoglycemic complications. Risk factors for coronary artery disease include diabetes mellitus, dyslipidemia, hypertension, obesity, sedentary lifestyle and post-menopausal. She is compliant with treatment most of the time. She is following a diabetic diet. She participates in exercise intermittently. Her breakfast blood glucose is taken between 8-9 am. Her breakfast blood glucose range is generally 110-130 mg/dl. An ACE inhibitor/angiotensin II receptor blocker is being taken. Eye exam is current.  Hypertension This is a chronic problem. The current episode started more than 1 year ago. The problem has been gradually improving since onset. The problem is controlled. Pertinent negatives include no chest pain. Risk factors for coronary artery disease include diabetes mellitus, dyslipidemia, obesity, sedentary lifestyle and post-menopausal state. Compliance problems include exercise.      Past Medical History:  Diagnosis Date  . Diabetes mellitus ([place])   . Diabetes mellitus without complication ([place])   . History of chicken pox   . History of measles   . History of mumps   . Hypertension   . Tilted uterus   . UTI (urinary tract infection)      Family  History  Problem Relation Age of Onset  . Hypertension Mother   . Hypertension Father      Current Outpatient Medications:  .  aspirin (ASPIR-LOW) 81 MG EC tablet, Take 81 mg by mouth daily. Swallow whole., Disp: , Rfl:  .  b complex vitamins tablet, Take 1 tablet by mouth daily., Disp: , Rfl:  .  CRESTOR 10 MG tablet, TAKE 1 TABLET BY MOUTH EVERY DAY, Disp: 90 tablet, Rfl: 1 .  diphenhydrAMINE (BENADRYL) 25 MG tablet, Take 1 tablet (25 mg total) by mouth every 6 (six) hours as needed for itching or allergies (Rash)., Disp: 30 tablet, Rfl: 0 .  EPINEPHrine (EPIPEN 2-PAK) 0.3 mg/0.3 mL IJ SOAJ injection, EpiPen 2-Pak 0.3 mg/0.3 mL injection, auto-injector, Disp: , Rfl:  .  fluticasone (FLONASE) 50 MCG/ACT nasal spray, SPRAY 2 SPRAYS INTO EACH NOSTRIL EVERY DAY, Disp: 16 mL, Rfl: 1 .  latanoprost (XALATAN) 0.005 % ophthalmic solution, , Disp: , Rfl:  .  levothyroxine (SYNTHROID, LEVOTHROID) 25 MCG tablet, Take 25 mcg by mouth daily before breakfast., Disp: , Rfl:  .  OZEMPIC, 0.25 OR 0.5 MG/DOSE, 2 MG/1.5ML SOPN, INJECT 0.5 MG INTO THE SKIN ONCE A WEEK., Disp: 3 pen, Rfl: 1 .  SYNTHROID 100 MCG tablet, TAKE 1 TABLET BY MOUTH EVERY DAY, Disp: 90 tablet, Rfl: 0 .  timolol (TIMOPTIC-XR) 0.5 % ophthalmic gel-forming, INSTILL 1 DROP INTO BOTH EYES IN THE MORNING, Disp: , Rfl: 4 .  valsartan-hydrochlorothiazide (DIOVAN-HCT) 160-12.5 MG tablet, TAKE 1 TABLET BY MOUTH EVERY DAY, Disp: 90 tablet, Rfl: 0 .  JANUMET 50-1000 MG tablet, TAKE 1 TABLET BY MOUTH TWICE A DAY WITH MEALS (  Patient not taking: Reported on 08/17/2019), Disp: 180 tablet, Rfl: 1   Allergies  Allergen Reactions  . Shellfish Allergy Anaphylaxis  . Shellfish Allergy      Review of Systems  Constitutional: Negative.  Negative for fatigue.  Respiratory: Negative.   Cardiovascular: Negative.  Negative for chest pain.  Gastrointestinal: Negative.   Neurological: Negative.   Psychiatric/Behavioral: Negative.      Today's Vitals    10/18/19 1031  BP: 130/84  Pulse: 66  Temp: 98.4 F (36.9 C)  TempSrc: Oral  Weight: 179 lb 6.4 oz (81.4 kg)  Height: _0  (1.499 m)   Body mass index is 36.23 kg/m.   Objective:  Physical Exam Vitals and nursing note reviewed.  Constitutional:      Appearance: Normal appearance. She is obese.  HENT:     Head: Normocephalic and atraumatic.  Cardiovascular:     Rate and Rhythm: Normal rate and regular rhythm.     Heart sounds: Normal heart sounds.  Pulmonary:     Effort: Pulmonary effort is normal.     Breath sounds: Normal breath sounds.  Skin:    General: Skin is warm.  Neurological:     General: No focal deficit present.     Mental Status: She is alert.  Psychiatric:        Mood and Affect: Mood normal.        Behavior: Behavior normal.         Assessment And Plan:     1. Uncontrolled type 2 diabetes mellitus with hyperglycemia (HCC)  Chronic. She will continue with current meds for now. I will check labs as listed below. Importance of dietary compliance was discussed with the patient. She is encouraged to start a regular exercise program.   - Hemoglobin A1c - BMP8+EGFR  2. Essential hypertension, benign  Chronic, controlled. She will continue with current meds. She is encouraged to limit her salt intake.   3. Primary hypothyroidism  I will check thyroid panel and adjust meds as needed.  - TSH  4. Class 2 severe obesity due to excess calories with serious comorbidity and body mass index (BMI) of 36.0 to 36.9 in adult (HCC)  Wt Readings from Last 3 Encounters:  10/18/19 179 lb 6.4 oz (81.4 kg)  08/17/19 180 lb 12.8 oz (82 kg)  07/05/19 179 lb 9.6 oz (81.5 kg)   She is encouraged to strive to lose ten percent of her body weight. She is advised to strive to get BMI less than 30 to decrease cardiac risk.   [name], MD    THE PATIENT IS ENCOURAGED TO PRACTICE SOCIAL DISTANCING DUE TO THE COVID-19 PANDEMIC.

## 2019-10-18 NOTE — Patient Instructions (Signed)

## 2019-10-19 ENCOUNTER — Other Ambulatory Visit: Payer: Self-pay | Admitting: Internal Medicine

## 2019-10-19 LAB — BMP8+EGFR
BUN/Creatinine Ratio: 21 (ref 12–28)
BUN: 17 mg/dL (ref 8–27)
CO2: 24 mmol/L (ref 20–29)
Calcium: 9.9 mg/dL (ref 8.7–10.3)
Chloride: 101 mmol/L (ref 96–106)
Creatinine, Ser: 0.8 mg/dL (ref 0.57–1.00)
GFR calc Af Amer: 88 mL/min/{1.73_m2} (ref >59)
GFR calc non Af Amer: 77 mL/min/{1.73_m2} (ref >59)
Glucose: 115 mg/dL — ABNORMAL HIGH (ref 65–99)
Potassium: 4.8 mmol/L (ref 3.5–5.2)
Sodium: 141 mmol/L (ref 134–144)

## 2019-10-19 LAB — TSH: TSH: 7.31 u[IU]/mL — ABNORMAL HIGH (ref 0.450–4.500)

## 2019-10-19 LAB — HEMOGLOBIN A1C
Est. average glucose Bld gHb Est-mCnc: 183 mg/dL
Hgb A1c MFr Bld: 8 % — ABNORMAL HIGH (ref 4.8–5.6)

## 2019-10-20 ENCOUNTER — Other Ambulatory Visit: Payer: Self-pay | Admitting: Internal Medicine

## 2019-10-21 ENCOUNTER — Telehealth: Payer: Self-pay

## 2019-10-24 ENCOUNTER — Other Ambulatory Visit: Payer: Self-pay | Admitting: Internal Medicine

## 2019-10-25 ENCOUNTER — Telehealth: Payer: Self-pay

## 2019-10-25 NOTE — Telephone Encounter (Signed)
-----   Message from Dorothyann Peng, MD sent at 10/20/2019  6:11 PM EST ----- Here are your lab results:  Your hba1c has gone up to 8.0.  What do you think has contributed to this?   Your TSH has gone up as well. Are you taking your thyroid meds? Are you taking vitamins? If yes, what time of day do you take these?   Please let me know if you have any questions or concerns. Stay safe!   Sincerely,    Robyn N. Allyne Gee, MD

## 2019-10-26 NOTE — Telephone Encounter (Signed)
The pt was notified of her most recent lab results. 

## 2019-10-27 ENCOUNTER — Other Ambulatory Visit: Payer: Self-pay

## 2019-10-27 MED ORDER — OZEMPIC (1 MG/DOSE) 2 MG/1.5ML ~~LOC~~ SOPN: 1.0000 mg | PEN_INJECTOR | SUBCUTANEOUS | 1 refills | Status: DC

## 2019-11-07 ENCOUNTER — Ambulatory Visit: Payer: Self-pay | Admitting: Pharmacist

## 2019-11-07 DIAGNOSIS — E1165 Type 2 diabetes mellitus with hyperglycemia: Secondary | ICD-10-CM

## 2019-11-08 ENCOUNTER — Other Ambulatory Visit: Payer: Self-pay | Admitting: Internal Medicine

## 2019-11-10 ENCOUNTER — Other Ambulatory Visit: Payer: Self-pay | Admitting: Internal Medicine

## 2019-11-18 ENCOUNTER — Ambulatory Visit: Payer: Self-pay | Admitting: Pharmacist

## 2019-11-18 DIAGNOSIS — E1165 Type 2 diabetes mellitus with hyperglycemia: Secondary | ICD-10-CM

## 2019-11-18 NOTE — Progress Notes (Signed)
  Chronic Care Management   Outreach Note  11/07/2019 Name: Tanya Levy MRN: [id number] DOB: July 24, 1951  Referred by: Dorothyann Peng, MD Reason for referral : Chronic Care Management and Diabetes   A second unsuccessful telephone outreach was attempted today. The patient was referred to the case management team for assistance with care management and care coordination.   Follow Up Plan: A HIPPA compliant phone message was left for the patient providing contact information and requesting a return call.  The care management team will reach out to the patient again over the next 10 days.   SIGNATURE Kieth Brightly, PharmD, BCPS Clinical Pharmacist, [facility]  [facility]  Direct Dial: [phone]

## 2019-11-18 NOTE — Progress Notes (Signed)
  Chronic Care Management   Outreach Note  11/18/2019 Name: Tanya Levy MRN: [id number] DOB: [birth year]  Referred by: Dorothyann Peng, MD Reason for referral : Chronic Care Management   A second unsuccessful telephone outreach was attempted today. The patient was referred to the case management team for assistance with care management and care coordination.   Follow Up Plan: A HIPPA compliant phone message was left for the patient providing contact information and requesting a return call.   SIGNATURE Kieth Brightly, PharmD, BCPS Clinical Pharmacist, [facility]  [facility]  Direct Dial: [phone]

## 2019-11-26 DIAGNOSIS — J0120 Acute ethmoidal sinusitis, unspecified: Secondary | ICD-10-CM | POA: Diagnosis not present

## 2019-12-05 ENCOUNTER — Ambulatory Visit: Payer: Self-pay | Admitting: Pharmacist

## 2019-12-05 DIAGNOSIS — E1165 Type 2 diabetes mellitus with hyperglycemia: Secondary | ICD-10-CM

## 2019-12-05 NOTE — Progress Notes (Signed)
  Chronic Care Management   Outreach Note  12/05/2019 Name: Tanya Levy MRN: [id number] DOB: 1950-10-07  Referred by: Dorothyann Peng, MD Reason for referral : Chronic Care Management and Diabetes   An unsuccessful telephone outreach was attempted today. The patient was referred to the case management team for assistance with care management and care coordination.   Follow Up Plan: A HIPPA compliant phone message was left for the patient providing contact information and requesting a return call.  The care management team will reach out to the patient again over the next 30 days.   SIGNATURE Kieth Brightly, PharmD, BCPS Clinical Pharmacist, [facility]  [facility]  Direct Dial: [phone]

## 2019-12-09 ENCOUNTER — Other Ambulatory Visit: Payer: Self-pay | Admitting: Internal Medicine

## 2019-12-13 ENCOUNTER — Other Ambulatory Visit: Payer: Self-pay | Admitting: Internal Medicine

## 2020-01-02 ENCOUNTER — Other Ambulatory Visit: Payer: Self-pay | Admitting: Internal Medicine

## 2020-01-03 ENCOUNTER — Other Ambulatory Visit: Payer: Self-pay

## 2020-01-03 MED ORDER — LEVOTHYROXINE SODIUM 100 MCG PO TABS: 100.0000 ug | ORAL_TABLET | Freq: Every day | ORAL | 1 refills | Status: DC

## 2020-01-03 NOTE — Telephone Encounter (Signed)
Pt called to switch from synthroid to levothyroxin due to cost

## 2020-01-17 ENCOUNTER — Other Ambulatory Visit: Payer: Self-pay | Admitting: Internal Medicine

## 2020-01-18 ENCOUNTER — Other Ambulatory Visit: Payer: Self-pay | Admitting: Internal Medicine

## 2020-02-14 ENCOUNTER — Encounter: Payer: Self-pay | Admitting: Internal Medicine

## 2020-02-23 ENCOUNTER — Encounter: Payer: Medicare Other | Admitting: Internal Medicine

## 2020-03-26 ENCOUNTER — Other Ambulatory Visit: Payer: Self-pay

## 2020-03-26 ENCOUNTER — Encounter: Payer: Self-pay | Admitting: Nurse Practitioner

## 2020-03-26 ENCOUNTER — Ambulatory Visit (INDEPENDENT_AMBULATORY_CARE_PROVIDER_SITE_OTHER): Payer: Medicare Other | Admitting: Nurse Practitioner

## 2020-03-26 VITALS — BP 140/78 | HR 100 | Temp 98.0°F | Ht 59.2 in | Wt 176.0 lb

## 2020-03-26 DIAGNOSIS — E1165 Type 2 diabetes mellitus with hyperglycemia: Secondary | ICD-10-CM

## 2020-03-26 DIAGNOSIS — E7800 Pure hypercholesterolemia, unspecified: Secondary | ICD-10-CM | POA: Diagnosis not present

## 2020-03-26 DIAGNOSIS — Z0000 Encounter for general adult medical examination without abnormal findings: Secondary | ICD-10-CM | POA: Diagnosis not present

## 2020-03-26 DIAGNOSIS — I10 Essential (primary) hypertension: Secondary | ICD-10-CM

## 2020-03-26 DIAGNOSIS — E039 Hypothyroidism, unspecified: Secondary | ICD-10-CM | POA: Diagnosis not present

## 2020-03-26 LAB — POCT UA - MICROALBUMIN
Albumin/Creatinine Ratio, Urine, POC: 30
Creatinine, POC: 300 mg/dL
Microalbumin Ur, POC: 10 mg/L

## 2020-03-26 LAB — POCT URINALYSIS DIPSTICK
Bilirubin, UA: NEGATIVE
Glucose, UA: NEGATIVE
Ketones, UA: NEGATIVE
Leukocytes, UA: NEGATIVE
Nitrite, UA: NEGATIVE
Protein, UA: NEGATIVE
Spec Grav, UA: 1.02 (ref 1.010–1.025)
Urobilinogen, UA: 0.2 E.U./dL
pH, UA: 5.5 (ref 5.0–8.0)

## 2020-03-26 NOTE — Progress Notes (Signed)
This visit occurred during the SARS-CoV-2 public health emergency.  Safety protocols were in place, including screening questions prior to the visit, additional usage of staff PPE, and extensive cleaning of exam room while observing appropriate contact time as indicated for disinfecting solutions.  Subjective:     Patient ID: Tanya Levy , female    DOB: 31-Mar-1951 , 69 y.o.   MRN: [id number]   Chief Complaint  Patient presents with  . Medicare Wellness    HPI  Here for her welcome to medicare  Wt Readings from Last 3 Encounters: 03/26/20 : 176 lb (79.8 kg) 10/18/19 : 179 lb 6.4 oz (81.4 kg) 08/17/19 : 180 lb 12.8 oz (82 kg)       Past Medical History:  Diagnosis Date  . Diabetes mellitus (HCC)   . Diabetes mellitus without complication (HCC)   . History of chicken pox   . History of measles   . History of mumps   . Hypertension   . Tilted uterus   . UTI (urinary tract infection)      Family History  Problem Relation Age of Onset  . Hypertension Mother   . Hypertension Father      Current Outpatient Medications:  .  aspirin (ASPIR-LOW) 81 MG EC tablet, Take 81 mg by mouth daily. Swallow whole., Disp: , Rfl:  .  b complex vitamins tablet, Take 1 tablet by mouth daily., Disp: , Rfl:  .  CRESTOR 10 MG tablet, TAKE 1 TABLET BY MOUTH EVERY DAY, Disp: 90 tablet, Rfl: 1 .  diphenhydrAMINE (BENADRYL) 25 MG tablet, Take 1 tablet (25 mg total) by mouth every 6 (six) hours as needed for itching or allergies (Rash)., Disp: 30 tablet, Rfl: 0 .  EPINEPHrine (EPIPEN 2-PAK) 0.3 mg/0.3 mL IJ SOAJ injection, EpiPen 2-Pak 0.3 mg/0.3 mL injection, auto-injector, Disp: , Rfl:  .  fluticasone (FLONASE) 50 MCG/ACT nasal spray, SPRAY 2 SPRAYS INTO EACH NOSTRIL EVERY DAY, Disp: 48 mL, Rfl: 1 .  JANUMET 50-1000 MG tablet, TAKE 1 TABLET BY MOUTH TWICE A DAY WITH MEALS, Disp: 180 tablet, Rfl: 1 .  latanoprost (XALATAN) 0.005 % ophthalmic solution, , Disp: , Rfl:  .  levothyroxine  (SYNTHROID) 100 MCG tablet, Take 1 tablet (100 mcg total) by mouth daily before breakfast., Disp: 90 tablet, Rfl: 1 .  Semaglutide, 1 MG/DOSE, (OZEMPIC, 1 MG/DOSE,) 2 MG/1.5ML SOPN, Inject 1 mg into the skin once a week., Disp: 6 pen, Rfl: 1 .  timolol (TIMOPTIC-XR) 0.5 % ophthalmic gel-forming, INSTILL 1 DROP INTO BOTH EYES IN THE MORNING, Disp: , Rfl: 4 .  valsartan-hydrochlorothiazide (DIOVAN-HCT) 160-12.5 MG tablet, TAKE 1 TABLET BY MOUTH EVERY DAY, Disp: 90 tablet, Rfl: 0   Allergies  Allergen Reactions  . Shellfish Allergy Anaphylaxis  . Shellfish Allergy      Review of Systems  Constitutional: Negative.   HENT: Negative.   Eyes: Negative.   Respiratory: Negative.   Cardiovascular: Negative.   Gastrointestinal: Negative.   Endocrine: Negative.   Genitourinary: Negative.   Musculoskeletal: Negative.   Skin: Negative.   Allergic/Immunologic: Negative.   Neurological: Negative.   Hematological: Negative.   Psychiatric/Behavioral: Negative.      Today's Vitals   03/26/20 1153  BP: 140/78  Pulse: 100  Temp: 98 F (36.7 C)  TempSrc: Oral  Weight: 176 lb (79.8 kg)  Height: 4' 11.2" (1.504 m)  PainSc: 0-No pain   Body mass index is 35.31 kg/m.   Objective:  Physical Exam Constitutional:  Appearance: Normal appearance. She is well-developed.  HENT:     Head: Normocephalic and atraumatic.     Right Ear: Hearing, tympanic membrane, ear canal and external ear normal.     Left Ear: Hearing, tympanic membrane, ear canal and external ear normal.     Nose: Nose normal.     Mouth/Throat:     Mouth: Mucous membranes are moist.  Eyes:     General: Lids are normal.     Conjunctiva/sclera: Conjunctivae normal.     Pupils: Pupils are equal, round, and reactive to light.     Funduscopic exam:    Right eye: No papilledema.        Left eye: No papilledema.  Neck:     Thyroid: No thyroid mass.     Vascular: No carotid bruit.  Cardiovascular:     Rate and Rhythm: Normal  rate and regular rhythm.     Pulses: Normal pulses.     Heart sounds: Normal heart sounds. No murmur heard.   Pulmonary:     Effort: Pulmonary effort is normal.     Breath sounds: Normal breath sounds.  Abdominal:     General: Abdomen is flat. Bowel sounds are normal.     Palpations: Abdomen is soft.  Musculoskeletal:        General: No swelling. Normal range of motion.     Cervical back: Full passive range of motion without pain, normal range of motion and neck supple.     Right lower leg: No edema.     Left lower leg: No edema.  Skin:    General: Skin is warm and dry.     Capillary Refill: Capillary refill takes less than 2 seconds.  Neurological:     General: No focal deficit present.     Mental Status: She is alert and oriented to person, place, and time.     Cranial Nerves: No cranial nerve deficit.     Sensory: No sensory deficit.  Psychiatric:        Mood and Affect: Mood normal.        Behavior: Behavior normal.        Thought Content: Thought content normal.        Judgment: Judgment normal.         Assessment And Plan:     1. Welcome to Medicare preventive visit  Pt's annual wellness exam was performed and geriatric assessment reviewed.   Pt has no new identiafble wellness concerns at this time.   WIll obtain routine labs.   Will obtain UA and micro.   Behavior modifications discussed and diet history reviewed. Pt will continue to exercise regularly and modify diet, with low GI, plant based foods and decrease food intake of processed foods.   Recommend intake of daily multivitamin, Vitamin D, and calcium.  Recommond mammogram and colonoscopy for preventive screenings, as well as recommend immunizations that include influenza (up to date) and TDAP  2. Essential hypertension, benign Chronic, fair control Urine micro is normal - POCT Urinalysis Dipstick (81002) - POCT UA - Microalbumin  3. Uncontrolled type 2 diabetes mellitus with hyperglycemia  (HCC)  Chronic, stable  Continue with current medications, no changes to medications  Encouraged to limit intake of sugary foods and drinks  Encouraged to increase physical activity to 150 minutes per week  4. Primary hypothyroidism  Chronic, will check levels and make changes to medications accordingly        Arnette FeltsJanece Myasia Sinatra, FNP   I, Lolita CramJanece  Christell Constant, FNP, have reviewed all documentation for this visit. The documentation on 03/26/20 for the exam, diagnosis, procedures, and orders are all accurate and complete.  THE PATIENT IS ENCOURAGED TO PRACTICE SOCIAL DISTANCING DUE TO THE COVID-19 PANDEMIC.     Subjective:    Chelesa D Laural Benes is a 69 y.o. female who presents for a Welcome to Medicare exam.   Review of Systems See above        Objective:    Today's Vitals   03/26/20 1153  BP: 140/78  Pulse: 100  Temp: 98 F (36.7 C)  TempSrc: Oral  Weight: 176 lb (79.8 kg)  Height: 4' 11.2" (1.504 m)  PainSc: 0-No pain  Body mass index is 35.31 kg/m.  Medications Outpatient Encounter Medications as of 03/26/2020  Medication Sig  . aspirin (ASPIR-LOW) 81 MG EC tablet Take 81 mg by mouth daily. Swallow whole.  . b complex vitamins tablet Take 1 tablet by mouth daily.  . CRESTOR 10 MG tablet TAKE 1 TABLET BY MOUTH EVERY DAY  . diphenhydrAMINE (BENADRYL) 25 MG tablet Take 1 tablet (25 mg total) by mouth every 6 (six) hours as needed for itching or allergies (Rash).  . EPINEPHrine (EPIPEN 2-PAK) 0.3 mg/0.3 mL IJ SOAJ injection EpiPen 2-Pak 0.3 mg/0.3 mL injection, auto-injector  . fluticasone (FLONASE) 50 MCG/ACT nasal spray SPRAY 2 SPRAYS INTO EACH NOSTRIL EVERY DAY  . JANUMET 50-1000 MG tablet TAKE 1 TABLET BY MOUTH TWICE A DAY WITH MEALS  . latanoprost (XALATAN) 0.005 % ophthalmic solution   . levothyroxine (SYNTHROID) 100 MCG tablet Take 1 tablet (100 mcg total) by mouth daily before breakfast.  . Semaglutide, 1 MG/DOSE, (OZEMPIC, 1 MG/DOSE,) 2 MG/1.5ML SOPN Inject 1 mg into the  skin once a week.  . timolol (TIMOPTIC-XR) 0.5 % ophthalmic gel-forming INSTILL 1 DROP INTO BOTH EYES IN THE MORNING  . valsartan-hydrochlorothiazide (DIOVAN-HCT) 160-12.5 MG tablet TAKE 1 TABLET BY MOUTH EVERY DAY   No facility-administered encounter medications on file as of 03/26/2020.     History: Past Medical History:  Diagnosis Date  . Diabetes mellitus (HCC)   . Diabetes mellitus without complication (HCC)   . History of chicken pox   . History of measles   . History of mumps   . Hypertension   . Tilted uterus   . UTI (urinary tract infection)    Past Surgical History:  Procedure Laterality Date  . EYE SURGERY    . KNEE ARTHROSCOPY    . KNEE SURGERY      Family History  Problem Relation Age of Onset  . Hypertension Mother   . Hypertension Father    Social History   Occupational History  . Not on file  Tobacco Use  . Smoking status: Never Smoker  . Smokeless tobacco: Never Used  Vaping Use  . Vaping Use: Never used  Substance and Sexual Activity  . Alcohol use: Yes    Comment: occasional  . Drug use: No  . Sexual activity: Yes    Birth control/protection: Post-menopausal    Tobacco Counseling Counseling given: Not Answered   Immunizations and Health Maintenance Immunization History  Administered Date(s) Administered  . DTaP 04/19/2012  . Influenza Inj Mdck Quad Pf 06/21/2019  . Influenza Nasal 06/24/2018  . Influenza,inj,Quad PF,6+ Mos 07/02/2018  . Influenza-Unspecified 07/02/2018, 06/21/2019  . PFIZER SARS-COV-2 Vaccination 10/04/2019, 10/25/2019  . Pneumococcal Conjugate-13 07/05/2019   Health Maintenance Due  Topic Date Due  . FOOT EXAM  02/14/2020    Activities of  Daily Living In your present state of health, do you have any difficulty performing the following activities: 08/17/2019  Hearing? N  Vision? N  Difficulty concentrating or making decisions? N  Walking or climbing stairs? N  Dressing or bathing? N  Doing errands, shopping? N   Some recent data might be hidden    Physical Exam  (optional), or other factors deemed appropriate based on the beneficiary's medical and social history and current clinical standards.  Advanced Directives: Does Patient Have a Medical Advance Directive?: No Would patient like information on creating a medical advance directive?: Yes (MAU/Ambulatory/Procedural Areas - Information given)    Assessment:    This is a routine wellness examination for this patient .   Vision/Hearing screen  Hearing Screening   125Hz  250Hz  500Hz  1000Hz  2000Hz  3000Hz  4000Hz  6000Hz  8000Hz   Right ear:   25 20 20  25     Left ear:   25 25 20  20       Visual Acuity Screening   Right eye Left eye Both eyes  Without correction: 20/50 20/30 20/20   With correction:       Dietary issues and exercise activities discussed:     Goals   None    Depression Screen PHQ 2/9 Scores 08/17/2019 07/05/2019 02/14/2019 08/24/2018  PHQ - 2 Score 0 0 0 0     Fall Risk Fall Risk  08/17/2019  Falls in the past year? 0    Cognitive Function:     6CIT Screen 03/26/2020  What Year? 0 points  What month? 0 points  What time? 0 points  Count back from 20 2 points  Months in reverse 0 points  Repeat phrase 0 points  Total Score 2    Patient Care Team: , MD as PCP - General (Internal Medicine) , MD (Internal Medicine) , , RN as Case Manager     Plan:    I have personally reviewed and noted the following in the patient's chart:   . Medical and social history . Use of alcohol, tobacco or illicit drugs  . Current medications and supplements . Functional ability and status . Nutritional status . Physical activity . Advanced directives . List of other physicians . Hospitalizations, surgeries, and ER visits in previous 12 months . Vitals . Screenings to include cognitive, depression, and falls . Referrals and appointments  In addition, I have reviewed and discussed  with patient certain preventive protocols, quality metrics, and best practice recommendations. A written personalized care plan for preventive services as well as general preventive health recommendations were provided to patient.     , FNP 03/26/2020

## 2020-03-27 LAB — CBC
Hematocrit: 38.2 % (ref 34.0–46.6)
Hemoglobin: 12 g/dL (ref 11.1–15.9)
MCH: 26.1 pg — ABNORMAL LOW (ref 26.6–33.0)
MCHC: 31.4 g/dL — ABNORMAL LOW (ref 31.5–35.7)
MCV: 83 fL (ref 79–97)
Platelets: 238 10*3/uL (ref 150–450)
RBC: 4.59 x10E6/uL (ref 3.77–5.28)
RDW: 13.6 % (ref 11.7–15.4)
WBC: 5.7 10*3/uL (ref 3.4–10.8)

## 2020-03-27 LAB — LIPID PANEL
Chol/HDL Ratio: 3.9 ratio (ref 0.0–4.4)
Cholesterol, Total: 157 mg/dL (ref 100–199)
HDL: 40 mg/dL (ref >39)
LDL Chol Calc (NIH): 90 mg/dL (ref 0–99)
Triglycerides: 152 mg/dL — ABNORMAL HIGH (ref 0–149)
VLDL Cholesterol Cal: 27 mg/dL (ref 5–40)

## 2020-03-27 LAB — HEMOGLOBIN A1C
Est. average glucose Bld gHb Est-mCnc: 163 mg/dL
Hgb A1c MFr Bld: 7.3 % — ABNORMAL HIGH (ref 4.8–5.6)

## 2020-03-27 LAB — T4: T4, Total: 10 ug/dL (ref 4.5–12.0)

## 2020-03-27 LAB — T3, FREE: T3, Free: 3.1 pg/mL (ref 2.0–4.4)

## 2020-03-27 LAB — TSH: TSH: 0.11 u[IU]/mL — ABNORMAL LOW (ref 0.450–4.500)

## 2020-04-02 IMAGING — MG DIGITAL SCREENING BILAT W/ CAD
4 series · 4 of 4 positions shown · non-contrast
Comparison: Previous exam(s).

CLINICAL DATA: Screening.

EXAM:
DIGITAL SCREENING BILATERAL MAMMOGRAM WITH CAD

[R MLO]
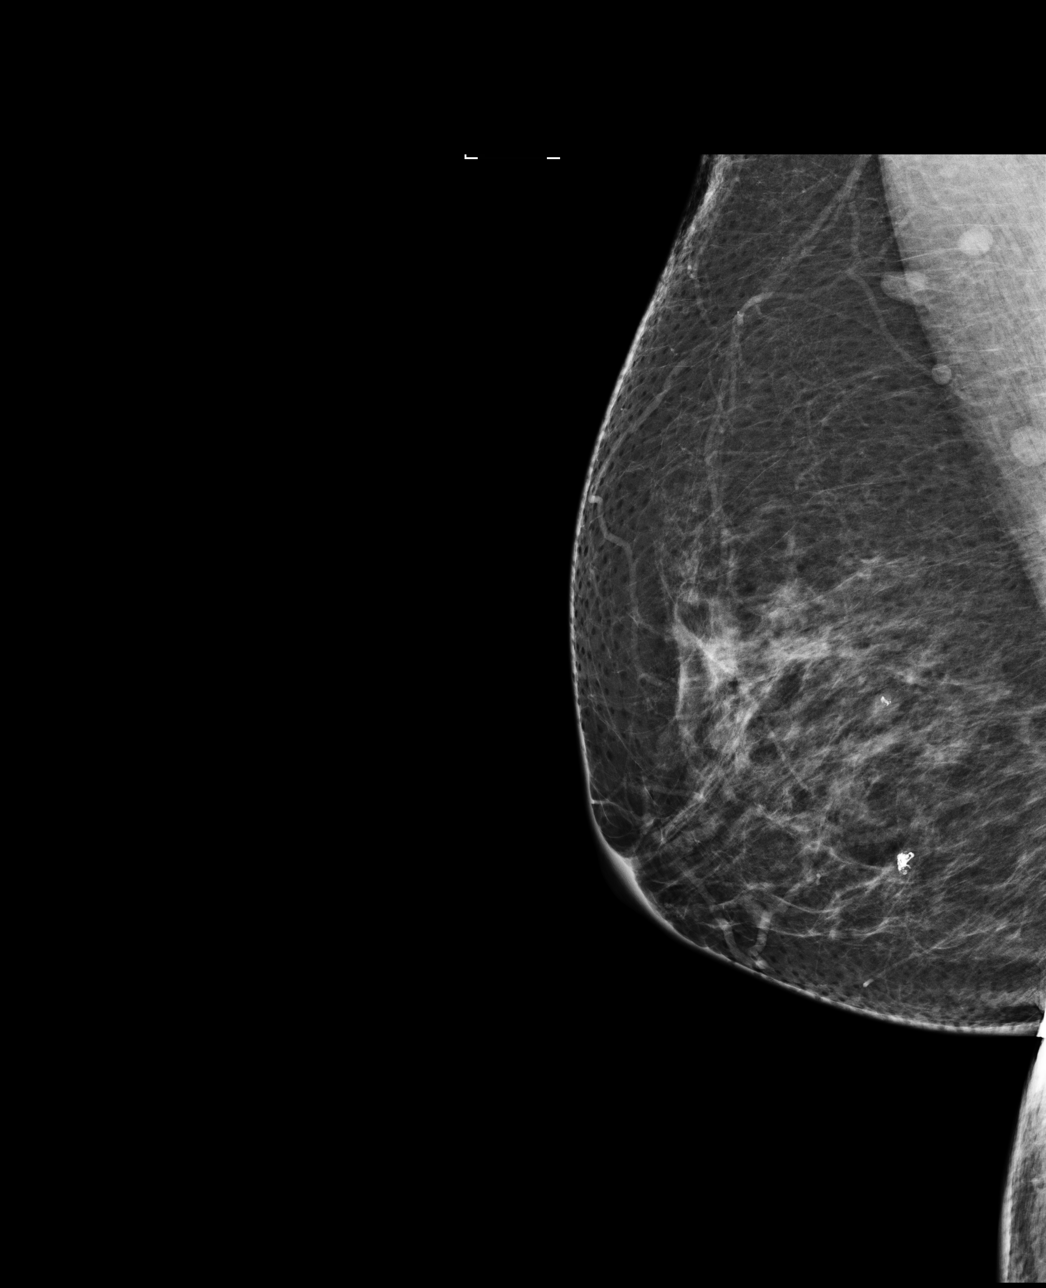

[L MLO]
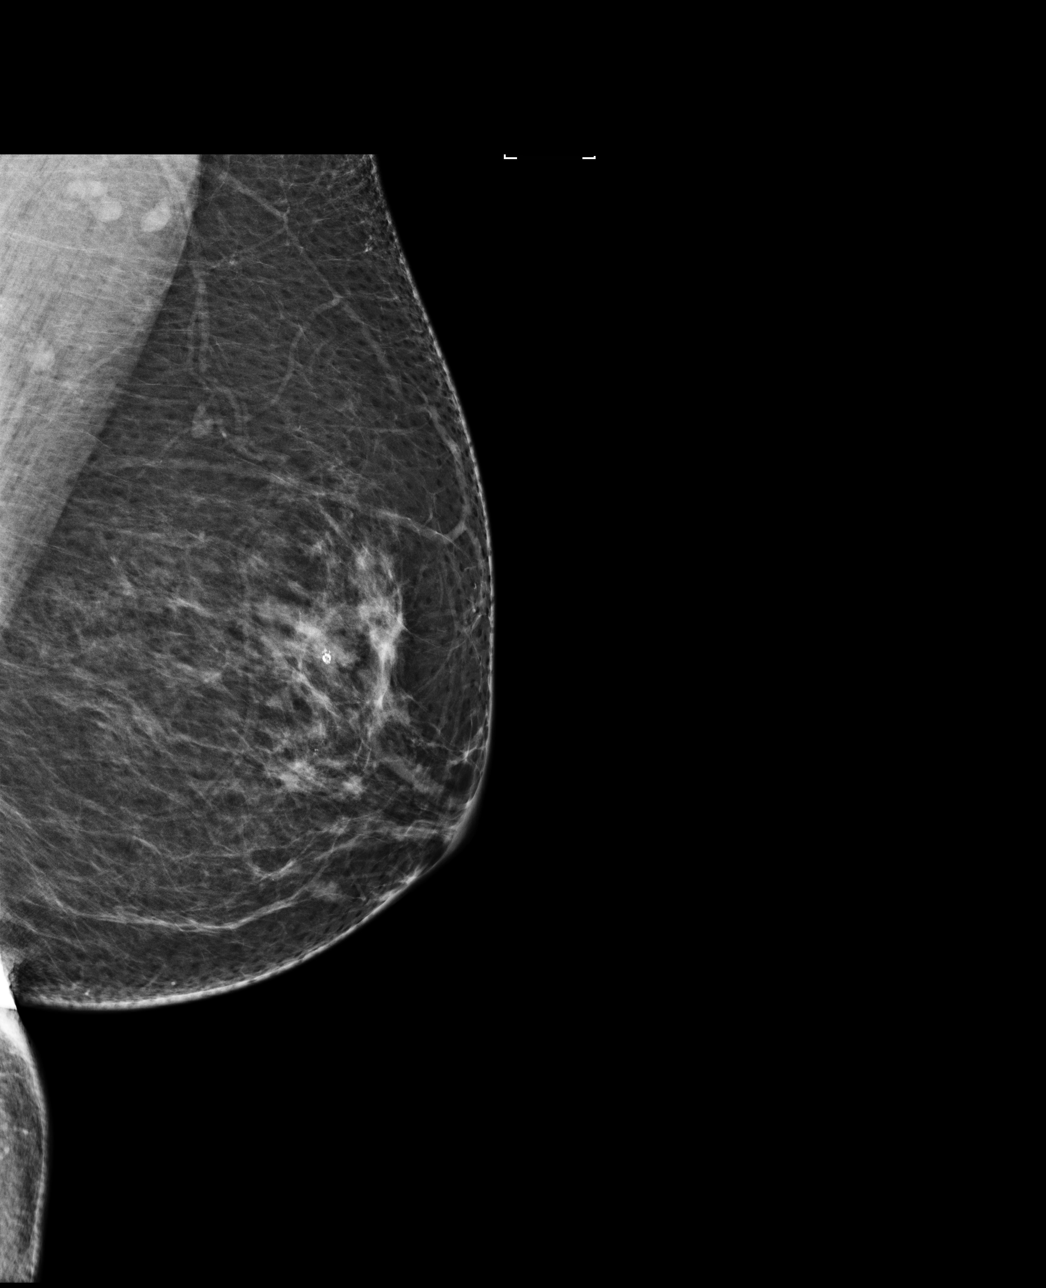

[L CC]
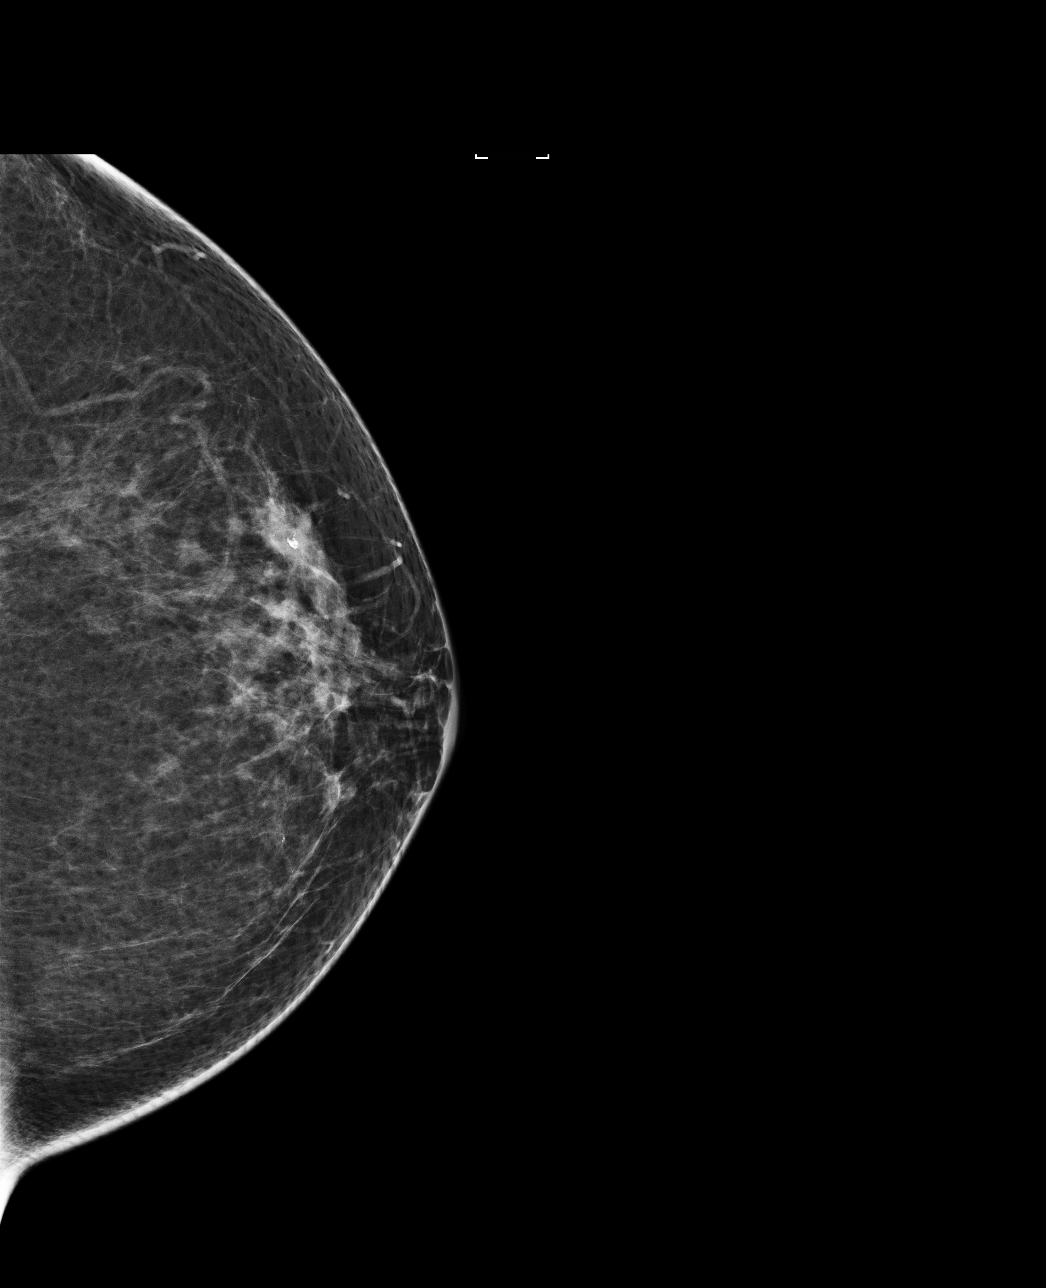

[R CC]
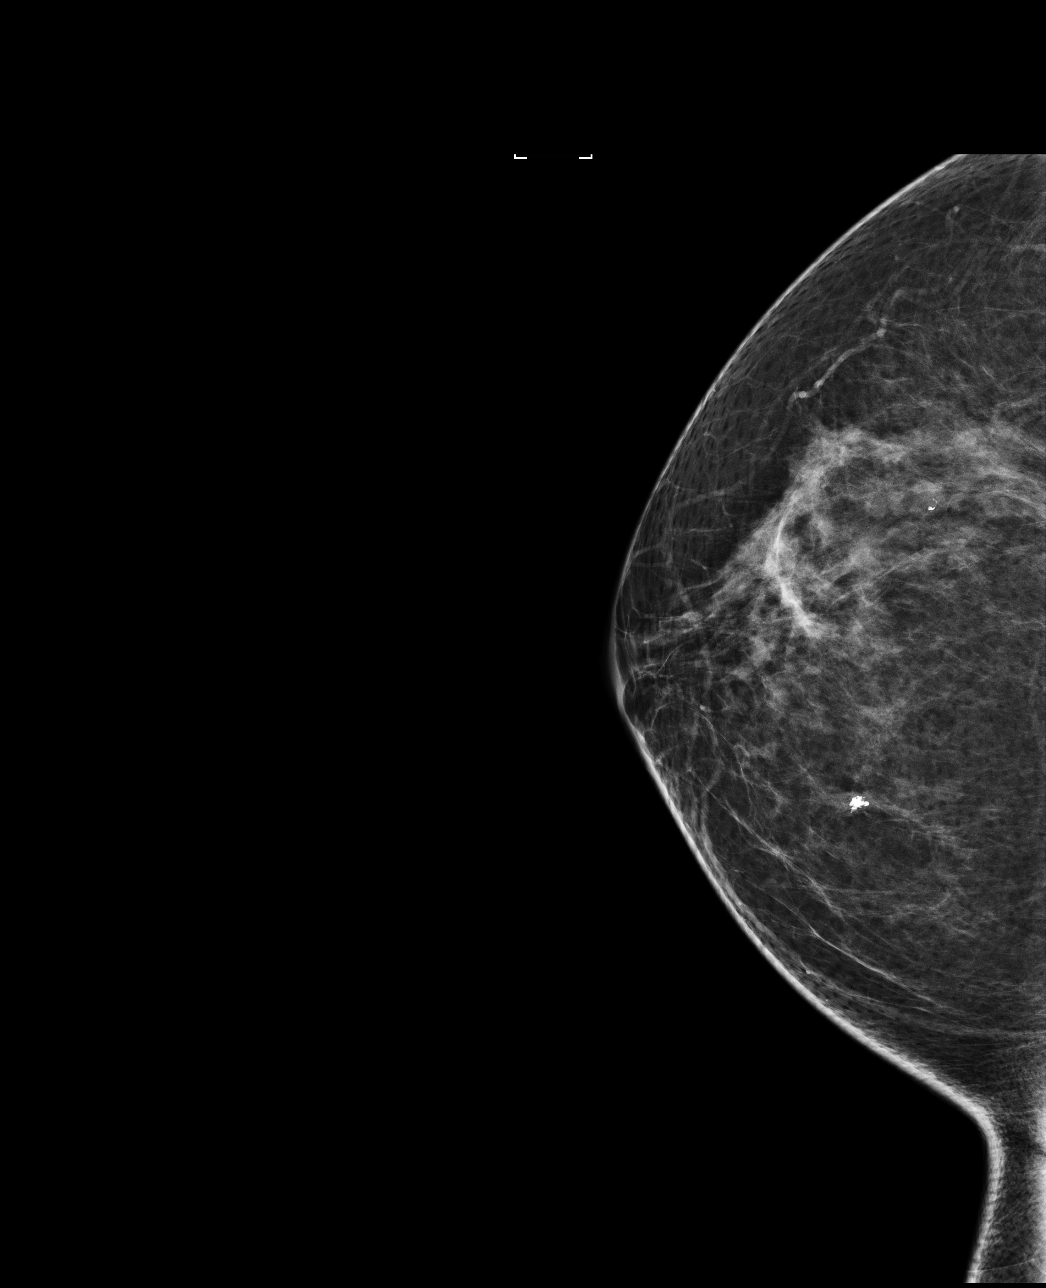

[4 of 4 positions shown; findings below may reference images not displayed]

ACR Breast Density Category b: There are scattered areas of
fibroglandular density.
FINDINGS: There are no findings suspicious for malignancy. Images were
processed with CAD.
IMPRESSION: No mammographic evidence of malignancy. A result letter of this
screening mammogram will be mailed directly to the patient.

RECOMMENDATION:
Screening mammogram in one year. (Code:AS-G-LCT)

BI-RADS CATEGORY  1: Negative.

## 2020-04-13 ENCOUNTER — Other Ambulatory Visit: Payer: Self-pay | Admitting: Internal Medicine

## 2020-04-18 ENCOUNTER — Other Ambulatory Visit: Payer: Self-pay | Admitting: Internal Medicine

## 2020-04-25 ENCOUNTER — Other Ambulatory Visit: Payer: Medicare Other

## 2020-04-25 ENCOUNTER — Other Ambulatory Visit: Payer: Self-pay

## 2020-04-25 DIAGNOSIS — E039 Hypothyroidism, unspecified: Secondary | ICD-10-CM

## 2020-04-26 LAB — TSH: TSH: 6.9 u[IU]/mL — ABNORMAL HIGH (ref 0.450–4.500)

## 2020-04-30 NOTE — Progress Notes (Signed)
She will now need to take everyday except for Sunday.  This may take a little time since she had not been seen prior to this in 6 months.  Have her to come back in 4 weeks for recheck TSH, Free T3 and T4

## 2020-04-30 NOTE — Progress Notes (Signed)
In reference to her Ozempic she needs to continue what she is on and no new medication at this time. Will recheck at her next appt.

## 2020-04-30 NOTE — Progress Notes (Signed)
Noted  

## 2020-05-31 ENCOUNTER — Other Ambulatory Visit: Payer: Self-pay

## 2020-05-31 ENCOUNTER — Other Ambulatory Visit: Payer: Medicare Other

## 2020-05-31 DIAGNOSIS — E039 Hypothyroidism, unspecified: Secondary | ICD-10-CM

## 2020-06-01 LAB — TSH: TSH: 1.09 u[IU]/mL (ref 0.450–4.500)

## 2020-06-01 LAB — T4: T4, Total: 9.3 ug/dL (ref 4.5–12.0)

## 2020-06-01 LAB — T3, FREE: T3, Free: 2.9 pg/mL (ref 2.0–4.4)

## 2020-06-26 ENCOUNTER — Other Ambulatory Visit: Payer: Self-pay | Admitting: Internal Medicine

## 2020-07-02 ENCOUNTER — Ambulatory Visit (INDEPENDENT_AMBULATORY_CARE_PROVIDER_SITE_OTHER): Payer: Medicare Other | Admitting: Internal Medicine

## 2020-07-02 ENCOUNTER — Other Ambulatory Visit: Payer: Self-pay

## 2020-07-02 ENCOUNTER — Encounter: Payer: Self-pay | Admitting: Internal Medicine

## 2020-07-02 VITALS — BP 138/70 | HR 80 | Temp 98.2°F | Ht 59.2 in | Wt 179.4 lb

## 2020-07-02 DIAGNOSIS — I10 Essential (primary) hypertension: Secondary | ICD-10-CM

## 2020-07-02 DIAGNOSIS — E1165 Type 2 diabetes mellitus with hyperglycemia: Secondary | ICD-10-CM

## 2020-07-02 DIAGNOSIS — E039 Hypothyroidism, unspecified: Secondary | ICD-10-CM | POA: Diagnosis not present

## 2020-07-02 DIAGNOSIS — Z6835 Body mass index (BMI) 35.0-35.9, adult: Secondary | ICD-10-CM

## 2020-07-02 NOTE — Progress Notes (Signed)
I,[name],acting as a Education administrator for [name], MD.,have documented all relevant documentation on the behalf of [name], MD,as directed by  [name], MD while in the presence of [name], MD.  This visit occurred during the SARS-CoV-2 public health emergency.  Safety protocols were in place, including screening questions prior to the visit, additional usage of staff PPE, and extensive cleaning of exam room while observing appropriate contact time as indicated for disinfecting solutions.  Subjective:     Patient ID: Tanya Levy , female    DOB: 09/02/1951 , 69 y.o.   MRN: [id number]   Chief Complaint  Patient presents with  . Diabetes  . Hypertension    HPI  Patient is here for DM/HTN follow up. She reports that she is compliant with medications. She has no concerns today.  She has already received her COVID booster.    Diabetes She presents for her follow-up diabetic visit. She has type 2 diabetes mellitus. Her disease course has been improving. There are no hypoglycemic associated symptoms. Pertinent negatives for diabetes include no chest pain and no fatigue. There are no hypoglycemic complications. Risk factors for coronary artery disease include diabetes mellitus, dyslipidemia, hypertension, obesity, sedentary lifestyle and post-menopausal. She is compliant with treatment most of the time. She is following a diabetic diet. She participates in exercise intermittently. Her breakfast blood glucose is taken between 8-9 am. Her breakfast blood glucose range is generally 110-130 mg/dl. An ACE inhibitor/angiotensin II receptor blocker is being taken. Eye exam is current.  Hypertension This is a chronic problem. The current episode started more than 1 year ago. The problem has been gradually improving since onset. The problem is controlled. Pertinent negatives include no chest pain. Risk factors for coronary artery disease include diabetes mellitus, dyslipidemia,  obesity, sedentary lifestyle and post-menopausal state. Compliance problems include exercise.      Past Medical History:  Diagnosis Date  . Diabetes mellitus ([place])   . Diabetes mellitus without complication ([place])   . History of chicken pox   . History of measles   . History of mumps   . Hypertension   . Tilted uterus   . UTI (urinary tract infection)      Family History  Problem Relation Age of Onset  . Hypertension Mother   . Hypertension Father      Current Outpatient Medications:  .  aspirin (ASPIR-LOW) 81 MG EC tablet, Take 81 mg by mouth daily. Swallow whole., Disp: , Rfl:  .  b complex vitamins tablet, Take 1 tablet by mouth daily., Disp: , Rfl:  .  CRESTOR 10 MG tablet, TAKE 1 TABLET BY MOUTH EVERY DAY, Disp: 90 tablet, Rfl: 1 .  diphenhydrAMINE (BENADRYL) 25 MG tablet, Take 1 tablet (25 mg total) by mouth every 6 (six) hours as needed for itching or allergies (Rash)., Disp: 30 tablet, Rfl: 0 .  EPINEPHrine (EPIPEN 2-PAK) 0.3 mg/0.3 mL IJ SOAJ injection, EpiPen 2-Pak 0.3 mg/0.3 mL injection, auto-injector, Disp: , Rfl:  .  fluticasone (FLONASE) 50 MCG/ACT nasal spray, SPRAY 2 SPRAYS INTO EACH NOSTRIL EVERY DAY, Disp: 48 mL, Rfl: 1 .  latanoprost (XALATAN) 0.005 % ophthalmic solution, , Disp: , Rfl:  .  levothyroxine (SYNTHROID) 100 MCG tablet, TAKE 1 TABLET BY MOUTH EVERY DAY, Disp: 90 tablet, Rfl: 1 .  OZEMPIC, 1 MG/DOSE, 2 MG/1.5ML SOPN, INJECT 1 MG INTO THE SKIN ONCE A WEEK., Disp: 3 mL, Rfl: 3 .  timolol (TIMOPTIC-XR) 0.5 % ophthalmic  gel-forming, INSTILL 1 DROP INTO BOTH EYES IN THE MORNING, Disp: , Rfl: 4 .  valsartan-hydrochlorothiazide (DIOVAN-HCT) 160-12.5 MG tablet, TAKE 1 TABLET BY MOUTH EVERY DAY, Disp: 90 tablet, Rfl: 2 .  influenza vaccine (FLUCELVAX - EGG FREE) 0.5 ML injection, Flucelvax Quad 2017-2018 (PF) 60 mcg (15 mcg x 4)/0.5 mL IM syringe, Disp: , Rfl:    Allergies  Allergen Reactions  . Shellfish Allergy Anaphylaxis  . Shellfish Allergy       Review of Systems  Constitutional: Negative.  Negative for fatigue.  Respiratory: Negative.   Cardiovascular: Negative.  Negative for chest pain.  Gastrointestinal: Negative.   Neurological: Negative.      Today's Vitals   07/02/20 0840  BP: 138/70  Pulse: 80  Temp: 98.2 F (36.8 C)  TempSrc: Oral  Weight: 179 lb 6.4 oz (81.4 kg)  Height: 4' 11.2" (1.504 m)   Body mass index is 35.99 kg/m.   Objective:  Physical Exam Vitals and nursing note reviewed.  Constitutional:      Appearance: Normal appearance. She is obese.  HENT:     Head: Normocephalic and atraumatic.  Cardiovascular:     Rate and Rhythm: Normal rate and regular rhythm.     Heart sounds: Normal heart sounds.  Pulmonary:     Effort: Pulmonary effort is normal.     Breath sounds: Normal breath sounds.  Skin:    General: Skin is warm.  Neurological:     General: No focal deficit present.     Mental Status: She is alert.  Psychiatric:        Mood and Affect: Mood normal.        Behavior: Behavior normal.         Assessment And Plan:     1. Uncontrolled type 2 diabetes mellitus with hyperglycemia (HCC) Comments: Chronic, I will check labs as listed below.  Encouraged to incorporate more exercise into her daily routine. She is scheduled for her eye exam this Wednesday, October 20th. She will rto in 4 months for re-evaluation.  - Hemoglobin A1c - CMP14+EGFR  2. Essential hypertension, benign Comments: Chronic, fair control. She is encouraged to incorporate more exercise into her daily routine. Also advised to avoid adding salt to her foods.   3. Primary hypothyroidism Comments: Previous thyroid labs from Sept 2021 were reviewed in full detail. She will continue with current meds.   4. Class 2 severe obesity due to excess calories with serious comorbidity and body mass index (BMI) of 35.0 to 35.9 in adult [facility]) Comments: She is encouraged to strive for BMI less than 30 to decrease cardiac risk.    Wt Readings from Last 3 Encounters:  07/02/20 179 lb 6.4 oz (81.4 kg)  03/26/20 176 lb (79.8 kg)  10/18/19 179 lb 6.4 oz (81.4 kg)      Patient was given opportunity to ask questions. Patient verbalized understanding of the plan and was able to repeat key elements of the plan. All questions were answered to their satisfaction.  [name], MD   I, [name], MD, have reviewed all documentation for this visit. The documentation on 07/02/20 for the exam, diagnosis, procedures, and orders are all accurate and complete.  THE PATIENT IS ENCOURAGED TO PRACTICE SOCIAL DISTANCING DUE TO THE COVID-19 PANDEMIC.

## 2020-07-02 NOTE — Patient Instructions (Signed)

## 2020-07-03 LAB — CMP14+EGFR
ALT: 26 IU/L (ref 0–32)
AST: 19 IU/L (ref 0–40)
Albumin/Globulin Ratio: 1.7 (ref 1.2–2.2)
Albumin: 4.5 g/dL (ref 3.8–4.8)
Alkaline Phosphatase: 88 IU/L (ref 44–121)
BUN/Creatinine Ratio: 23 (ref 12–28)
BUN: 19 mg/dL (ref 8–27)
Bilirubin Total: 0.3 mg/dL (ref 0.0–1.2)
CO2: 25 mmol/L (ref 20–29)
Calcium: 9.6 mg/dL (ref 8.7–10.3)
Chloride: 100 mmol/L (ref 96–106)
Creatinine, Ser: 0.81 mg/dL (ref 0.57–1.00)
GFR calc Af Amer: 87 mL/min/{1.73_m2} (ref >59)
GFR calc non Af Amer: 75 mL/min/{1.73_m2} (ref >59)
Globulin, Total: 2.6 g/dL (ref 1.5–4.5)
Glucose: 140 mg/dL — ABNORMAL HIGH (ref 65–99)
Potassium: 4.2 mmol/L (ref 3.5–5.2)
Sodium: 141 mmol/L (ref 134–144)
Total Protein: 7.1 g/dL (ref 6.0–8.5)

## 2020-07-03 LAB — HEMOGLOBIN A1C
Est. average glucose Bld gHb Est-mCnc: 160 mg/dL
Hgb A1c MFr Bld: 7.2 % — ABNORMAL HIGH (ref 4.8–5.6)

## 2020-07-04 LAB — HM DIABETES EYE EXAM

## 2020-08-13 ENCOUNTER — Other Ambulatory Visit: Payer: Self-pay | Admitting: Internal Medicine

## 2020-10-24 ENCOUNTER — Other Ambulatory Visit: Payer: Self-pay | Admitting: Internal Medicine

## 2020-10-25 ENCOUNTER — Other Ambulatory Visit: Payer: Self-pay | Admitting: Internal Medicine

## 2020-10-25 DIAGNOSIS — Z1231 Encounter for screening mammogram for malignant neoplasm of breast: Secondary | ICD-10-CM

## 2020-10-26 ENCOUNTER — Other Ambulatory Visit: Payer: Self-pay

## 2020-10-26 NOTE — Telephone Encounter (Signed)
Prior auth done for brand name crestor waiting on a response from the [employer].

## 2020-10-31 ENCOUNTER — Ambulatory Visit (INDEPENDENT_AMBULATORY_CARE_PROVIDER_SITE_OTHER): Payer: Medicare Other | Admitting: Internal Medicine

## 2020-10-31 ENCOUNTER — Other Ambulatory Visit: Payer: Self-pay | Admitting: Internal Medicine

## 2020-10-31 ENCOUNTER — Other Ambulatory Visit: Payer: Self-pay

## 2020-10-31 ENCOUNTER — Encounter: Payer: Self-pay | Admitting: Internal Medicine

## 2020-10-31 VITALS — BP 118/76 | HR 92 | Temp 97.9°F | Ht 59.2 in | Wt 176.8 lb

## 2020-10-31 DIAGNOSIS — E039 Hypothyroidism, unspecified: Secondary | ICD-10-CM

## 2020-10-31 DIAGNOSIS — E1165 Type 2 diabetes mellitus with hyperglycemia: Secondary | ICD-10-CM

## 2020-10-31 DIAGNOSIS — I10 Essential (primary) hypertension: Secondary | ICD-10-CM | POA: Diagnosis not present

## 2020-10-31 DIAGNOSIS — E7800 Pure hypercholesterolemia, unspecified: Secondary | ICD-10-CM

## 2020-10-31 DIAGNOSIS — Z6835 Body mass index (BMI) 35.0-35.9, adult: Secondary | ICD-10-CM

## 2020-10-31 DIAGNOSIS — Z23 Encounter for immunization: Secondary | ICD-10-CM

## 2020-10-31 NOTE — Patient Instructions (Signed)
High Cholesterol  High cholesterol is a condition in which the blood has high levels of a white, waxy substance similar to fat (cholesterol). The liver makes all the cholesterol that the body needs. The human body needs small amounts of cholesterol to help build cells. A person gets extra or excess cholesterol from the food that he or she eats. The blood carries cholesterol from the liver to the rest of the body. If you have high cholesterol, deposits (plaques) may build up on the walls of your arteries. Arteries are the blood vessels that carry blood away from your heart. These plaques make the arteries narrow and stiff. Cholesterol plaques increase your risk for heart attack and stroke. Work with your health care provider to keep your cholesterol levels in a healthy range. What increases the risk? The following factors may make you more likely to develop this condition:  Eating foods that are high in animal fat (saturated fat) or cholesterol.  Being overweight.  Not getting enough exercise.  A family history of high cholesterol (familial hypercholesterolemia).  Use of tobacco products.  Having diabetes. What are the signs or symptoms? There are no symptoms of this condition. How is this diagnosed? This condition may be diagnosed based on the results of a blood test.  If you are older than 69 years of age, your health care provider may check your cholesterol levels every 4-6 years.  You may be checked more often if you have high cholesterol or other risk factors for heart disease. The blood test for cholesterol measures:  "Bad" cholesterol, or LDL cholesterol. This is the main type of cholesterol that causes heart disease. The desired level is less than 100 mg/dL.  "Good" cholesterol, or HDL cholesterol. HDL helps protect against heart disease by cleaning the arteries and carrying the LDL to the liver for processing. The desired level for HDL is 60 mg/dL or higher.  Triglycerides.  These are fats that your body can store or burn for energy. The desired level is less than 150 mg/dL.  Total cholesterol. This measures the total amount of cholesterol in your blood and includes LDL, HDL, and triglycerides. The desired level is less than 200 mg/dL. How is this treated? This condition may be treated with:  Diet changes. You may be asked to eat foods that have more fiber and less saturated fats or added sugar.  Lifestyle changes. These may include regular exercise, maintaining a healthy weight, and quitting use of tobacco products.  Medicines. These are given when diet and lifestyle changes have not worked. You may be prescribed a statin medicine to help lower your cholesterol levels. Follow these instructions at home: Eating and drinking  Eat a healthy, balanced diet. This diet includes: ? Daily servings of a variety of fresh, frozen, or canned fruits and vegetables. ? Daily servings of whole grain foods that are rich in fiber. ? Foods that are low in saturated fats and trans fats. These include poultry and fish without skin, lean cuts of meat, and low-fat dairy products. ? A variety of fish, especially oily fish that contain omega-3 fatty acids. Aim to eat fish at least 2 times a week.  Avoid foods and drinks that have added sugar.  Use healthy cooking methods, such as roasting, grilling, broiling, baking, poaching, steaming, and stir-frying. Do not fry your food except for stir-frying.   Lifestyle  Get regular exercise. Aim to exercise for a total of 150 minutes a week. Increase your activity level by doing activities   such as gardening, walking, and taking the stairs.  Do not use any products that contain nicotine or tobacco, such as cigarettes, e-cigarettes, and chewing tobacco. If you need help quitting, ask your health care provider.   General instructions  Take over-the-counter and prescription medicines only as told by your health care provider.  Keep all  follow-up visits as told by your health care provider. This is important. Where to find more information  American Heart Association: www.heart.org  National Heart, Lung, and Blood Institute: www.nhlbi.nih.gov Contact a health care provider if:  You have trouble achieving or maintaining a healthy diet or weight.  You are starting an exercise program.  You are unable to stop smoking. Get help right away if:  You have chest pain.  You have trouble breathing.  You have any symptoms of a stroke. "BE FAST" is an easy way to remember the main warning signs of a stroke: ? B - Balance. Signs are dizziness, sudden trouble walking, or loss of balance. ? E - Eyes. Signs are trouble seeing or a sudden change in vision. ? F - Face. Signs are sudden weakness or numbness of the face, or the face or eyelid drooping on one side. ? A - Arms. Signs are weakness or numbness in an arm. This happens suddenly and usually on one side of the body. ? S - Speech. Signs are sudden trouble speaking, slurred speech, or trouble understanding what people say. ? T - Time. Time to call emergency services. Write down what time symptoms started.  You have other signs of a stroke, such as: ? A sudden, severe headache with no known cause. ? Nausea or vomiting. ? Seizure. These symptoms may represent a serious problem that is an emergency. Do not wait to see if the symptoms will go away. Get medical help right away. Call your local emergency services (911 in the [place] Do not drive yourself to the hospital. Summary  Cholesterol plaques increase your risk for heart attack and stroke. Work with your health care provider to keep your cholesterol levels in a healthy range.  Eat a healthy, balanced diet, get regular exercise, and maintain a healthy weight.  Do not use any products that contain nicotine or tobacco, such as cigarettes, e-cigarettes, and chewing tobacco.  Get help right away if you have any symptoms of a  stroke. This information is not intended to replace advice given to you by your health care provider. Make sure you discuss any questions you have with your health care provider. Document Revised: 08/01/2019 Document Reviewed: 08/01/2019 Elsevier Patient Education  2021 Elsevier Inc.  

## 2020-10-31 NOTE — Progress Notes (Signed)
I,Tanya Levy,acting as a Education administrator for Tanya Greenland, MD.,have documented all relevant documentation on the behalf of Tanya Greenland, MD,as directed by  Tanya Greenland, MD while in the presence of Tanya Greenland, MD.  This visit occurred during the SARS-CoV-2 public health emergency.  Safety protocols were in place, including screening questions prior to the visit, additional usage of staff PPE, and extensive cleaning of exam room while observing appropriate contact time as indicated for disinfecting solutions.  Subjective:     Patient ID: Tanya Levy , female    DOB: 1951/06/02 , 70 y.o.   MRN: [id number]   Chief Complaint  Patient presents with  . Hyperlipidemia    HPI  Patient is here for diabetes, blood pressure, cholesterol, and thyroid follow-up.  She reports compliance with meds. She admits she has been exercising regularly. Needs refill of rosuvastatin, now willing to take generic.   Diabetes She presents for her follow-up diabetic visit. She has type 2 diabetes mellitus. Her disease course has been improving. There are no hypoglycemic associated symptoms. Pertinent negatives for diabetes include no fatigue. There are no hypoglycemic complications. Risk factors for coronary artery disease include diabetes mellitus, dyslipidemia, hypertension, obesity, sedentary lifestyle and post-menopausal. She is compliant with treatment most of the time. She is following a diabetic diet. She participates in exercise intermittently. Her breakfast blood glucose is taken between 8-9 am. Her breakfast blood glucose range is generally 110-130 mg/dl. An ACE inhibitor/angiotensin II receptor blocker is being taken. Eye exam is current.  Hypertension This is a chronic problem. The current episode started more than 1 year ago. The problem has been gradually improving since onset. The problem is controlled. Risk factors for coronary artery disease include diabetes mellitus, dyslipidemia, obesity,  sedentary lifestyle and post-menopausal state. Compliance problems include exercise.      Past Medical History:  Diagnosis Date  . Diabetes mellitus ([place])   . Diabetes mellitus without complication ([place])   . History of chicken pox   . History of measles   . History of mumps   . Hypertension   . Tilted uterus   . UTI (urinary tract infection)      Family History  Problem Relation Age of Onset  . Hypertension Mother   . Hypertension Father      Current Outpatient Medications:  .  aspirin 81 MG EC tablet, Take 81 mg by mouth daily. Swallow whole., Disp: , Rfl:  .  b complex vitamins tablet, Take 1 tablet by mouth daily. 3 times per week, Disp: , Rfl:  .  diphenhydrAMINE (BENADRYL) 25 MG tablet, Take 1 tablet (25 mg total) by mouth every 6 (six) hours as needed for itching or allergies (Rash)., Disp: 30 tablet, Rfl: 0 .  dorzolamide-timolol (COSOPT) 22.3-6.8 MG/ML ophthalmic solution, SMARTSIG:In Eye(s), Disp: , Rfl:  .  EPINEPHrine 0.3 mg/0.3 mL IJ SOAJ injection, EpiPen 2-Pak 0.3 mg/0.3 mL injection, auto-injector, Disp: , Rfl:  .  fluticasone (FLONASE) 50 MCG/ACT nasal spray, SPRAY 2 SPRAYS INTO EACH NOSTRIL EVERY DAY, Disp: 48 mL, Rfl: 1 .  latanoprost (XALATAN) 0.005 % ophthalmic solution, , Disp: , Rfl:  .  OZEMPIC, 1 MG/DOSE, 4 MG/3ML SOPN, INJECT 1 MG INTO THE SKIN ONCE A WEEK., Disp: 3 mL, Rfl: 3 .  rosuvastatin (CRESTOR) 10 MG tablet, Take 1 tablet (10 mg total) by mouth daily., Disp: 90 tablet, Rfl: 0 .  levothyroxine (SYNTHROID) 100 MCG tablet, TAKE 1 TABLET BY MOUTH EVERY DAY, Disp: 90 tablet,  Rfl: 1 .  valsartan-hydrochlorothiazide (DIOVAN-HCT) 160-12.5 MG tablet, TAKE 1 TABLET BY MOUTH EVERY DAY, Disp: 90 tablet, Rfl: 2   Allergies  Allergen Reactions  . Shellfish Allergy Anaphylaxis  . Shellfish Allergy      Review of Systems  Constitutional: Negative.  Negative for fatigue.  Respiratory: Negative.   Cardiovascular: Negative.   Gastrointestinal: Negative.    Psychiatric/Behavioral: Negative.   All other systems reviewed and are negative.    Today's Vitals   10/31/20 0853  BP: 118/76  Pulse: 92  Temp: 97.9 F (36.6 C)  TempSrc: Oral  Weight: 176 lb 12.8 oz (80.2 kg)  Height: 4' 11.2" (1.504 m)  PainSc: 0-No pain   Body mass index is 35.47 kg/m.  Wt Readings from Last 3 Encounters:  10/31/20 176 lb 12.8 oz (80.2 kg)  07/02/20 179 lb 6.4 oz (81.4 kg)  03/26/20 176 lb (79.8 kg)   Objective:  Physical Exam Vitals and nursing note reviewed.  Constitutional:      Appearance: Normal appearance. She is obese.  HENT:     Head: Normocephalic and atraumatic.  Cardiovascular:     Rate and Rhythm: Normal rate and regular rhythm.     Heart sounds: Normal heart sounds.  Pulmonary:     Breath sounds: Normal breath sounds.  Skin:    General: Skin is warm.  Neurological:     General: No focal deficit present.     Mental Status: She is alert and oriented to person, place, and time.         Assessment And Plan:     1. Uncontrolled type 2 diabetes mellitus with hyperglycemia (HCC) Comments: Chronic, I will check labs as listed below. I will adjust meds as needed.  - BMP8+EGFR - Hemoglobin A1c  2. Essential hypertension, benign Comments: Chronic, well controlled. She will continue with current meds and advised to follow low salt diet.   3. Primary hypothyroidism Comments: I will check TSH and adjust meds as needed. - TSH  4. Pure hypercholesterolemia Comments: I will refill rosuvastatin. She is now willing to take generic. Encoruaged to stay well hydrated and take vit D while on statin therapy.   5. Class 2 severe obesity due to excess calories with serious comorbidity and body mass index (BMI) of 35.0 to 35.9 in adult [facility]) Comments: She is encouraged to stirve for BMI less than 30 to decrease cairdiac risk. Advised to aim for at least 150 minutes of exercise per week.   6. Immunization due Comments: She is due for Pneumovax.  She agrees to rto in 2 weeks for its administration.   Patient was given opportunity to ask questions. Patient verbalized understanding of the plan and was able to repeat key elements of the plan. All questions were answered to their satisfaction.   I, Tanya Greenland, MD, have reviewed all documentation for this visit. The documentation on 10/31/20 for the exam, diagnosis, procedures, and orders are all accurate and complete.  THE PATIENT IS ENCOURAGED TO PRACTICE SOCIAL DISTANCING DUE TO THE COVID-19 PANDEMIC.

## 2020-11-01 ENCOUNTER — Other Ambulatory Visit: Payer: Self-pay

## 2020-11-01 ENCOUNTER — Encounter: Payer: Self-pay | Admitting: Internal Medicine

## 2020-11-01 LAB — BMP8+EGFR
BUN/Creatinine Ratio: 20 (ref 12–28)
BUN: 17 mg/dL (ref 8–27)
CO2: 23 mmol/L (ref 20–29)
Calcium: 9.8 mg/dL (ref 8.7–10.3)
Chloride: 99 mmol/L (ref 96–106)
Creatinine, Ser: 0.86 mg/dL (ref 0.57–1.00)
GFR calc Af Amer: 80 mL/min/{1.73_m2} (ref >59)
GFR calc non Af Amer: 70 mL/min/{1.73_m2} (ref >59)
Glucose: 122 mg/dL — ABNORMAL HIGH (ref 65–99)
Potassium: 4.7 mmol/L (ref 3.5–5.2)
Sodium: 139 mmol/L (ref 134–144)

## 2020-11-01 LAB — HEMOGLOBIN A1C
Est. average glucose Bld gHb Est-mCnc: 160 mg/dL
Hgb A1c MFr Bld: 7.2 % — ABNORMAL HIGH (ref 4.8–5.6)

## 2020-11-01 LAB — TSH: TSH: 0.558 u[IU]/mL (ref 0.450–4.500)

## 2020-11-01 MED ORDER — ROSUVASTATIN CALCIUM 10 MG PO TABS: 10.0000 mg | ORAL_TABLET | Freq: Every day | ORAL | 1 refills | Status: DC

## 2020-11-05 ENCOUNTER — Other Ambulatory Visit: Payer: Self-pay

## 2020-11-05 MED ORDER — ROSUVASTATIN CALCIUM 10 MG PO TABS: 10.0000 mg | ORAL_TABLET | Freq: Every day | ORAL | 1 refills | Status: DC

## 2020-11-06 ENCOUNTER — Other Ambulatory Visit: Payer: Self-pay

## 2020-11-06 ENCOUNTER — Ambulatory Visit: Payer: Medicare Other

## 2020-12-06 ENCOUNTER — Other Ambulatory Visit: Payer: Self-pay | Admitting: Internal Medicine

## 2020-12-11 ENCOUNTER — Telehealth: Payer: Self-pay

## 2020-12-11 NOTE — Telephone Encounter (Signed)
The pt called and said to let Dr. Allyne Gee know that she has stopped the generic crestor because it is causing her bad muscle aches.

## 2020-12-11 NOTE — Telephone Encounter (Signed)
Prior auth done for brand name Crestor.

## 2020-12-17 ENCOUNTER — Ambulatory Visit: Payer: BC Managed Care – PPO

## 2020-12-24 ENCOUNTER — Other Ambulatory Visit: Payer: Self-pay

## 2020-12-24 MED ORDER — SCOPOLAMINE 1 MG/3DAYS TD PT72: 1.0000 | MEDICATED_PATCH | TRANSDERMAL | 1 refills | Status: DC

## 2020-12-24 NOTE — Telephone Encounter (Signed)
The pt was notified that her motion sickness patches has been sent to the pharmacy and for the pt be careful on her cruise.

## 2021-01-31 ENCOUNTER — Ambulatory Visit: Payer: BC Managed Care – PPO

## 2021-02-14 ENCOUNTER — Telehealth: Payer: Self-pay

## 2021-02-14 NOTE — Telephone Encounter (Signed)
Prior auth done for brand name Crestor, waiting on a response form the pt's insurance.

## 2021-02-27 ENCOUNTER — Encounter: Payer: Self-pay | Admitting: Internal Medicine

## 2021-02-27 ENCOUNTER — Other Ambulatory Visit: Payer: Self-pay

## 2021-02-27 ENCOUNTER — Ambulatory Visit (INDEPENDENT_AMBULATORY_CARE_PROVIDER_SITE_OTHER): Payer: Medicare Other | Admitting: Internal Medicine

## 2021-02-27 VITALS — BP 116/80 | HR 80 | Temp 98.1°F | Wt 179.2 lb

## 2021-02-27 DIAGNOSIS — E7800 Pure hypercholesterolemia, unspecified: Secondary | ICD-10-CM | POA: Diagnosis not present

## 2021-02-27 DIAGNOSIS — I10 Essential (primary) hypertension: Secondary | ICD-10-CM | POA: Diagnosis not present

## 2021-02-27 DIAGNOSIS — E1165 Type 2 diabetes mellitus with hyperglycemia: Secondary | ICD-10-CM

## 2021-02-27 DIAGNOSIS — E66812 Obesity, class 2: Secondary | ICD-10-CM

## 2021-02-27 DIAGNOSIS — Z6835 Body mass index (BMI) 35.0-35.9, adult: Secondary | ICD-10-CM

## 2021-02-27 DIAGNOSIS — Z23 Encounter for immunization: Secondary | ICD-10-CM

## 2021-02-27 LAB — HEMOGLOBIN A1C
Est. average glucose Bld gHb Est-mCnc: 151 mg/dL
Hgb A1c MFr Bld: 6.9 % — ABNORMAL HIGH (ref 4.8–5.6)

## 2021-02-27 LAB — CMP14+EGFR
ALT: 19 IU/L (ref 0–32)
AST: 14 IU/L (ref 0–40)
Albumin/Globulin Ratio: 1.5 (ref 1.2–2.2)
Albumin: 4.5 g/dL (ref 3.8–4.8)
Alkaline Phosphatase: 85 IU/L (ref 44–121)
BUN/Creatinine Ratio: 24 (ref 12–28)
BUN: 21 mg/dL (ref 8–27)
Bilirubin Total: 0.3 mg/dL (ref 0.0–1.2)
CO2: 22 mmol/L (ref 20–29)
Calcium: 9.3 mg/dL (ref 8.7–10.3)
Chloride: 102 mmol/L (ref 96–106)
Creatinine, Ser: 0.87 mg/dL (ref 0.57–1.00)
Globulin, Total: 3.1 g/dL (ref 1.5–4.5)
Glucose: 109 mg/dL — ABNORMAL HIGH (ref 65–99)
Potassium: 4.8 mmol/L (ref 3.5–5.2)
Sodium: 142 mmol/L (ref 134–144)
Total Protein: 7.6 g/dL (ref 6.0–8.5)
eGFR: 73 mL/min/{1.73_m2} (ref >59)

## 2021-02-27 MED ORDER — ATORVASTATIN CALCIUM 40 MG PO TABS: ORAL_TABLET | ORAL | 1 refills | Status: DC

## 2021-02-27 NOTE — Progress Notes (Signed)
I,Tanya Levy,acting as a Education administrator for Tanya Greenland, MD.,have documented all relevant documentation on the behalf of Tanya Greenland, MD,as directed by  Tanya Greenland, MD while in the presence of Tanya Greenland, MD.  This visit occurred during the SARS-CoV-2 public health emergency.  Safety protocols were in place, including screening questions prior to the visit, additional usage of staff PPE, and extensive cleaning of exam room while observing appropriate contact time as indicated for disinfecting solutions.  Subjective:     Patient ID: Tanya Levy , female    DOB: 05-08-1951 , 70 y.o.   MRN: [id number]   Chief Complaint  Patient presents with   Diabetes   Hypertension    HPI  Patient is here for diabetes, blood pressure check. She reports compliance with meds. She has been unable to tolerate rosuvastatin. She has tolerated Crestor in the past; however, her insurance will no longer pay for brand name medication.   Diabetes She presents for her follow-up diabetic visit. She has type 2 diabetes mellitus. Her disease course has been improving. There are no hypoglycemic associated symptoms. Pertinent negatives for diabetes include no fatigue. There are no hypoglycemic complications. Risk factors for coronary artery disease include diabetes mellitus, dyslipidemia, hypertension, obesity, sedentary lifestyle and post-menopausal. She is compliant with treatment most of the time. She is following a diabetic diet. She participates in exercise intermittently. Her breakfast blood glucose is taken between 8-9 am. Her breakfast blood glucose range is generally 110-130 mg/dl. An ACE inhibitor/angiotensin II receptor blocker is being taken. Eye exam is current.  Hypertension This is a chronic problem. The current episode started more than 1 year ago. The problem has been gradually improving since onset. The problem is controlled. Risk factors for coronary artery disease include diabetes  mellitus, dyslipidemia, obesity, sedentary lifestyle and post-menopausal state. Compliance problems include exercise.     Past Medical History:  Diagnosis Date   Diabetes mellitus ([place])    Diabetes mellitus without complication ([place])    History of chicken pox    History of measles    History of mumps    Hypertension    Tilted uterus    UTI (urinary tract infection)      Family History  Problem Relation Age of Onset   Hypertension Mother    Hypertension Father      Current Outpatient Medications:    aspirin 81 MG EC tablet, Take 81 mg by mouth daily. Swallow whole., Disp: , Rfl:    atorvastatin (LIPITOR) 40 MG tablet, Take daily M-F and skip Sat/Sun, Disp: 30 tablet, Rfl: 1   b complex vitamins tablet, Take 1 tablet by mouth daily. 3 times per week, Disp: , Rfl:    diphenhydrAMINE (BENADRYL) 25 MG tablet, Take 1 tablet (25 mg total) by mouth every 6 (six) hours as needed for itching or allergies ([place])., Disp: 30 tablet, Rfl: 0   dorzolamide-timolol (COSOPT) 22.3-6.8 MG/ML ophthalmic solution, SMARTSIG:In Eye(s), Disp: , Rfl:    EPINEPHrine 0.3 mg/0.3 mL IJ SOAJ injection, EpiPen 2-Pak 0.3 mg/0.3 mL injection, auto-injector, Disp: , Rfl:    fluticasone (FLONASE) 50 MCG/ACT nasal spray, SPRAY 2 SPRAYS INTO EACH NOSTRIL EVERY DAY, Disp: 48 mL, Rfl: 1   latanoprost (XALATAN) 0.005 % ophthalmic solution, , Disp: , Rfl:    levothyroxine (SYNTHROID) 100 MCG tablet, TAKE 1 TABLET BY MOUTH EVERY DAY, Disp: 90 tablet, Rfl: 1   OZEMPIC, 1 MG/DOSE, 4 MG/3ML SOPN, INJECT 1 MG INTO THE SKIN ONCE A  WEEK., Disp: 3 mL, Rfl: 3   valsartan-hydrochlorothiazide (DIOVAN-HCT) 160-12.5 MG tablet, TAKE 1 TABLET BY MOUTH EVERY DAY, Disp: 90 tablet, Rfl: 2   scopolamine (TRANSDERM-SCOP, 1.5 MG,) 1 MG/3DAYS, Place 1 patch (1.5 mg total) onto the skin every 3 (three) days. prn (Patient not taking: Reported on 02/27/2021), Disp: 4 patch, Rfl: 1   Allergies  Allergen Reactions   Shellfish Allergy Anaphylaxis    Shellfish Allergy      Review of Systems  Constitutional: Negative.  Negative for fatigue.  Cardiovascular: Negative.   Gastrointestinal: Negative.   Psychiatric/Behavioral: Negative.    All other systems reviewed and are negative.   Today's Vitals   02/27/21 0913  BP: 116/80  Pulse: 80  Temp: 98.1 F (36.7 C)  TempSrc: Oral  Weight: 179 lb 3.2 oz (81.3 kg)  PainSc: 0-No pain   Body mass index is 35.95 kg/m.  Wt Readings from Last 3 Encounters:  02/27/21 179 lb 3.2 oz (81.3 kg)  10/31/20 176 lb 12.8 oz (80.2 kg)  07/02/20 179 lb 6.4 oz (81.4 kg)    BP Readings from Last 3 Encounters:  02/27/21 116/80  10/31/20 118/76  07/02/20 138/70     Objective:  Physical Exam Vitals and nursing note reviewed.  Constitutional:      Appearance: Normal appearance.  HENT:     Head: Normocephalic and atraumatic.     Nose:     Comments: Masked     Mouth/Throat:     Comments: Masked  Cardiovascular:     Rate and Rhythm: Normal rate and regular rhythm.     Heart sounds: Normal heart sounds.  Pulmonary:     Effort: Pulmonary effort is normal.     Breath sounds: Normal breath sounds.  Skin:    General: Skin is warm.  Neurological:     General: No focal deficit present.     Mental Status: She is alert.  Psychiatric:        Mood and Affect: Mood normal.        Behavior: Behavior normal.        Assessment And Plan:     1. Uncontrolled type 2 diabetes mellitus with hyperglycemia (HCC) Comments: Chronic, I will check an an a1c today. Encouraged to continue with her regular exercise regimen.  - Hemoglobin A1c - CMP14+EGFR  2. Essential hypertension, benign Comments: Chronic, well controlled. She will c/w current meds. Encouraged to follow low sodium diet.   3. Pure hypercholesterolemia Comments: Unable to tolerate generic rosuvastatin. I will send rx atorvastatin 36m M-F. I will recheck chol in six weeks.   4. Class 2 severe obesity due to excess calories with serious  comorbidity and body mass index (BMI) of 35.0 to 35.9 in adult ([facility] Comments: She is encouraged to strive for BMI less than 30 to decrease cardiac risk. Advised to aim for at least 150 minutes of exercise per week.  5. Immunization due Comments: She plans to get her second Shingles vaccine after her Mammogram July 2022.    Patient was given opportunity to ask questions. Patient verbalized understanding of the plan and was able to repeat key elements of the plan. All questions were answered to their satisfaction.   I, RMaximino Greenland MD, have reviewed all documentation for this visit. The documentation on 02/27/21 for the exam, diagnosis, procedures, and orders are all accurate and complete.   IF YOU HAVE BEEN REFERRED TO A SPECIALIST, IT MAY TAKE 1-2 WEEKS TO SCHEDULE/PROCESS THE REFERRAL. IF YOU  HAVE NOT HEARD FROM US/SPECIALIST IN TWO WEEKS, PLEASE GIVE [place] A CALL AT [phone] X [phone].   THE PATIENT IS ENCOURAGED TO PRACTICE SOCIAL DISTANCING DUE TO THE COVID-19 PANDEMIC.

## 2021-03-20 ENCOUNTER — Other Ambulatory Visit: Payer: Self-pay | Admitting: Internal Medicine

## 2021-03-22 ENCOUNTER — Other Ambulatory Visit: Payer: Self-pay | Admitting: Internal Medicine

## 2021-03-25 ENCOUNTER — Other Ambulatory Visit: Payer: Self-pay | Admitting: Internal Medicine

## 2021-03-26 ENCOUNTER — Ambulatory Visit
Admission: RE | Admit: 2021-03-26 | Discharge: 2021-03-26 | Disposition: A | Discharge status: Home / Self Care | Payer: Medicare Other | Source: Ambulatory Visit | Attending: Internal Medicine | Admitting: Internal Medicine

## 2021-03-26 ENCOUNTER — Other Ambulatory Visit: Payer: Self-pay

## 2021-03-26 DIAGNOSIS — Z1231 Encounter for screening mammogram for malignant neoplasm of breast: Secondary | ICD-10-CM

## 2021-03-28 ENCOUNTER — Ambulatory Visit (INDEPENDENT_AMBULATORY_CARE_PROVIDER_SITE_OTHER): Payer: Medicare Other

## 2021-03-28 VITALS — Ht 60.0 in | Wt 178.0 lb

## 2021-03-28 DIAGNOSIS — Z0000 Encounter for general adult medical examination without abnormal findings: Secondary | ICD-10-CM | POA: Diagnosis not present

## 2021-03-28 NOTE — Patient Instructions (Signed)
Tanya Levy , Thank you for taking time to come for your Medicare Wellness Visit. I appreciate your ongoing commitment to your health goals. Please review the following plan we discussed and let me know if I can assist you in the future.   Screening recommendations/referrals: Colonoscopy: completed 08/01/2017 Mammogram: completed 03/26/2021 Bone Density: completed 05/20/2018 Recommended yearly ophthalmology/optometry visit for glaucoma screening and checkup Recommended yearly dental visit for hygiene and checkup  Vaccinations: Influenza vaccine: completed 07/17/2020, due 04/15/2021 Pneumococcal vaccine: completed 11/12/2020 Tdap vaccine: completed 04/19/2012, due 04/19/2022 Shingles vaccine: needs second dose   Covid-19: 12/25/2020, 05/22/2020, 10/25/2019, 10/04/2019  Advanced directives: Advance directive discussed with you today.   Conditions/risks identified: none  Next appointment: Follow up in one year for your annual wellness visit    Preventive Care 65 Years and Older, Female Preventive care refers to lifestyle choices and visits with your health care provider that can promote health and wellness. What does preventive care include? A yearly physical exam. This is also called an annual well check. Dental exams once or twice a year. Routine eye exams. Ask your health care provider how often you should have your eyes checked. Personal lifestyle choices, including: Daily care of your teeth and gums. Regular physical activity. Eating a healthy diet. Avoiding tobacco and drug use. Limiting alcohol use. Practicing safe sex. Taking low-dose aspirin every day. Taking vitamin and mineral supplements as recommended by your health care provider. What happens during an annual well check? The services and screenings done by your health care provider during your annual well check will depend on your age, overall health, lifestyle risk factors, and family history of disease. Counseling  Your  health care provider may ask you questions about your: Alcohol use. Tobacco use. Drug use. Emotional well-being. Home and relationship well-being. Sexual activity. Eating habits. History of falls. Memory and ability to understand (cognition). Work and work Astronomer. Reproductive health. Screening  You may have the following tests or measurements: Height, weight, and BMI. Blood pressure. Lipid and cholesterol levels. These may be checked every 5 years, or more frequently if you are over 65 years old. Skin check. Lung cancer screening. You may have this screening every year starting at age 6 if you have a 30-pack-year history of smoking and currently smoke or have quit within the past 15 years. Fecal occult blood test (FOBT) of the stool. You may have this test every year starting at age 63. Flexible sigmoidoscopy or colonoscopy. You may have a sigmoidoscopy every 5 years or a colonoscopy every 10 years starting at age 64. Hepatitis C blood test. Hepatitis B blood test. Sexually transmitted disease (STD) testing. Diabetes screening. This is done by checking your blood sugar (glucose) after you have not eaten for a while (fasting). You may have this done every 1-3 years. Bone density scan. This is done to screen for osteoporosis. You may have this done starting at age 68. Mammogram. This may be done every 1-2 years. Talk to your health care provider about how often you should have regular mammograms. Talk with your health care provider about your test results, treatment options, and if necessary, the need for more tests. Vaccines  Your health care provider may recommend certain vaccines, such as: Influenza vaccine. This is recommended every year. Tetanus, diphtheria, and acellular pertussis (Tdap, Td) vaccine. You may need a Td booster every 10 years. Zoster vaccine. You may need this after age 14. Pneumococcal 13-valent conjugate (PCV13) vaccine. One dose is recommended after age  42.  Pneumococcal polysaccharide (PPSV23) vaccine. One dose is recommended after age 64. Talk to your health care provider about which screenings and vaccines you need and how often you need them. This information is not intended to replace advice given to you by your health care provider. Make sure you discuss any questions you have with your health care provider. Document Released: 09/28/2015 Document Revised: 05/21/2016 Document Reviewed: 07/03/2015 Elsevier Interactive Patient Education  2017 [place] Prevention in the Home Falls can cause injuries. They can happen to people of all ages. There are many things you can do to make your home safe and to help prevent falls. What can I do on the outside of my home? Regularly fix the edges of walkways and driveways and fix any cracks. Remove anything that might make you trip as you walk through a door, such as a raised step or threshold. Trim any bushes or trees on the path to your home. Use bright outdoor lighting. Clear any walking paths of anything that might make someone trip, such as rocks or tools. Regularly check to see if handrails are loose or broken. Make sure that both sides of any steps have handrails. Any raised decks and porches should have guardrails on the edges. Have any leaves, snow, or ice cleared regularly. Use sand or salt on walking paths during winter. Clean up any spills in your garage right away. This includes oil or grease spills. What can I do in the bathroom? Use night lights. Install grab bars by the toilet and in the tub and shower. Do not use towel bars as grab bars. Use non-skid mats or decals in the tub or shower. If you need to sit down in the shower, use a plastic, non-slip stool. Keep the floor dry. Clean up any water that spills on the floor as soon as it happens. Remove soap buildup in the tub or shower regularly. Attach bath mats securely with double-sided non-slip rug tape. Do not have throw  rugs and other things on the floor that can make you trip. What can I do in the bedroom? Use night lights. Make sure that you have a light by your bed that is easy to reach. Do not use any sheets or blankets that are too big for your bed. They should not hang down onto the floor. Have a firm chair that has side arms. You can use this for support while you get dressed. Do not have throw rugs and other things on the floor that can make you trip. What can I do in the kitchen? Clean up any spills right away. Avoid walking on wet floors. Keep items that you use a lot in easy-to-reach places. If you need to reach something above you, use a strong step stool that has a grab bar. Keep electrical cords out of the way. Do not use floor polish or wax that makes floors slippery. If you must use wax, use non-skid floor wax. Do not have throw rugs and other things on the floor that can make you trip. What can I do with my stairs? Do not leave any items on the stairs. Make sure that there are handrails on both sides of the stairs and use them. Fix handrails that are broken or loose. Make sure that handrails are as long as the stairways. Check any carpeting to make sure that it is firmly attached to the stairs. Fix any carpet that is loose or worn. Avoid having throw rugs at the top  or bottom of the stairs. If you do have throw rugs, attach them to the floor with carpet tape. Make sure that you have a light switch at the top of the stairs and the bottom of the stairs. If you do not have them, ask someone to add them for you. What else can I do to help prevent falls? Wear shoes that: Do not have high heels. Have rubber bottoms. Are comfortable and fit you well. Are closed at the toe. Do not wear sandals. If you use a stepladder: Make sure that it is fully opened. Do not climb a closed stepladder. Make sure that both sides of the stepladder are locked into place. Ask someone to hold it for you, if  possible. Clearly mark and make sure that you can see: Any grab bars or handrails. First and last steps. Where the edge of each step is. Use tools that help you move around (mobility aids) if they are needed. These include: Canes. Walkers. Scooters. Crutches. Turn on the lights when you go into a dark area. Replace any light bulbs as soon as they burn out. Set up your furniture so you have a clear path. Avoid moving your furniture around. If any of your floors are uneven, fix them. If there are any pets around you, be aware of where they are. Review your medicines with your doctor. Some medicines can make you feel dizzy. This can increase your chance of falling. Ask your doctor what other things that you can do to help prevent falls. This information is not intended to replace advice given to you by your health care provider. Make sure you discuss any questions you have with your health care provider. Document Released: 06/28/2009 Document Revised: 02/07/2016 Document Reviewed: 10/06/2014 Elsevier Interactive Patient Education  2017 [employer].

## 2021-03-28 NOTE — Progress Notes (Signed)
I connected with Tanya Levy today by telephone and verified that I am speaking with the correct person using two identifiers. Location patient: home Location provider: work Persons participating in the virtual visit: Tanya Levy, [employer] LPN.   I discussed the limitations, risks, security and privacy concerns of performing an evaluation and management service by telephone and the availability of in person appointments. I also discussed with the patient that there may be a patient responsible charge related to this service. The patient expressed understanding and verbally consented to this telephonic visit.    Interactive audio and video telecommunications were attempted between this provider and patient, however failed, due to patient having technical difficulties OR patient did not have access to video capability.  We continued and completed visit with audio only.     Vital signs may be patient reported or missing.  Subjective:   Tanya Levy is a 70 y.o. female who presents for Medicare Annual (Subsequent) preventive examination.  Review of Systems     Cardiac Risk Factors include: advanced age (>59men, >33 women);diabetes mellitus;hypertension;obesity (BMI >30kg/m2)     Objective:    Today's Vitals   03/28/21 0911  Weight: 178 lb (80.7 kg)  Height: 5' (1.524 m)   Body mass index is 34.76 kg/m.  Advanced Directives 03/28/2021 03/26/2020 01/01/2016  Does Patient Have a Medical Advance Directive? No No No  Would patient like information on creating a medical advance directive? - Yes (MAU/Ambulatory/Procedural Areas - Information given) -    Current Medications (verified) Outpatient Encounter Medications as of 03/28/2021  Medication Sig   aspirin 81 MG EC tablet Take 81 mg by mouth daily. Swallow whole.   atorvastatin (LIPITOR) 40 MG tablet TAKE 1 TABLET BY MOUTH DAILY MONDAY THROUGH FRIDAY AND SKIP SAT/SUN   b complex vitamins tablet Take 1  tablet by mouth daily. 3 times per week   diphenhydrAMINE (BENADRYL) 25 MG tablet Take 1 tablet (25 mg total) by mouth every 6 (six) hours as needed for itching or allergies (Rash).   dorzolamide-timolol (COSOPT) 22.3-6.8 MG/ML ophthalmic solution SMARTSIG:In Eye(s)   EPINEPHrine 0.3 mg/0.3 mL IJ SOAJ injection EpiPen 2-Pak 0.3 mg/0.3 mL injection, auto-injector   fluticasone (FLONASE) 50 MCG/ACT nasal spray SPRAY 2 SPRAYS INTO EACH NOSTRIL EVERY DAY   latanoprost (XALATAN) 0.005 % ophthalmic solution    levothyroxine (SYNTHROID) 100 MCG tablet TAKE 1 TABLET BY MOUTH EVERY DAY   OZEMPIC, 1 MG/DOSE, 4 MG/3ML SOPN INJECT 1 MG INTO THE SKIN ONCE A WEEK.   valsartan-hydrochlorothiazide (DIOVAN-HCT) 160-12.5 MG tablet TAKE 1 TABLET BY MOUTH EVERY DAY   scopolamine (TRANSDERM-SCOP, 1.5 MG,) 1 MG/3DAYS Place 1 patch (1.5 mg total) onto the skin every 3 (three) days. prn (Patient not taking: No sig reported)   No facility-administered encounter medications on file as of 03/28/2021.    Allergies (verified) Shellfish allergy and Shellfish allergy   History: Past Medical History:  Diagnosis Date   Diabetes mellitus (HCC)    Diabetes mellitus without complication (HCC)    History of chicken pox    History of measles    History of mumps    Hypertension    Tilted uterus    UTI (urinary tract infection)    Past Surgical History:  Procedure Laterality Date   EYE SURGERY     KNEE ARTHROSCOPY     KNEE SURGERY     Family History  Problem Relation Age of Onset   Hypertension Mother    Hypertension Father  Social History   Socioeconomic History   Marital status: Married    Spouse name: Not on file   Number of children: Not on file   Years of education: Not on file   Highest education level: Not on file  Occupational History   Not on file  Tobacco Use   Smoking status: Never   Smokeless tobacco: Never  Vaping Use   Vaping Use: Never used  Substance and Sexual Activity   Alcohol  use: Yes    Comment: occasional   Drug use: No   Sexual activity: Yes    Birth control/protection: Post-menopausal  Other Topics Concern   Not on file  Social History Narrative   ** Merged History Encounter **       Social Determinants of Health   Financial Resource Strain: Low Risk    Difficulty of Paying Living Expenses: Not hard at all  Food Insecurity: No Food Insecurity   Worried About Programme researcher, broadcasting/film/videounning Out of Food in the Last Year: Never true   Ran Out of Food in the Last Year: Never true  Transportation Needs: No Transportation Needs   Lack of Transportation (Medical): No   Lack of Transportation (Non-Medical): No  Physical Activity: Insufficiently Active   Days of Exercise per Week: 3 days   Minutes of Exercise per Session: 40 min  Stress: No Stress Concern Present   Feeling of Stress : Not at all  Social Connections: Not on file    Tobacco Counseling Counseling given: Not Answered   Clinical Intake:  Pre-visit preparation completed: Yes  Pain : No/denies pain     Nutritional Status: BMI > 30  Obese Nutritional Risks: None Diabetes: Yes  How often do you need to have someone help you when you read instructions, pamphlets, or other written materials from your doctor or pharmacy?: 1 - Never What is the last grade level you completed in school?: masters degree  Diabetic? Yes Nutrition Risk Assessment:  Has the patient had any N/V/D within the last 2 months?  No  Does the patient have any non-healing wounds?  No  Has the patient had any unintentional weight loss or weight gain?  No   Diabetes:  Is the patient diabetic?  Yes  If diabetic, was a CBG obtained today?  No  Did the patient bring in their glucometer from home?  No  How often do you monitor your CBG's? Twice daily.   Financial Strains and Diabetes Management:  Are you having any financial strains with the device, your supplies or your medication? No .  Does the patient want to be seen by Chronic  Care Management for management of their diabetes?  No  Would the patient like to be referred to a Nutritionist or for Diabetic Management?  No   Diabetic Exams:  Diabetic Eye Exam: Completed 07/04/2020 Diabetic Foot Exam: Overdue, Pt has been advised about the importance in completing this exam. Pt is scheduled for diabetic foot exam on next appointment.   Interpreter Needed?: No  Information entered by :: NAllen LPN   Activities of Daily Living In your present state of health, do you have any difficulty performing the following activities: 03/28/2021 10/31/2020  Hearing? N N  Vision? N N  Difficulty concentrating or making decisions? N N  Walking or climbing stairs? N Y  Dressing or bathing? N N  Doing errands, shopping? N N  Preparing Food and eating ? N -  Using the Toilet? N -  In the past six  months, have you accidently leaked urine? N -  Do you have problems with loss of bowel control? N -  Managing your Medications? N -  Managing your Finances? N -  Housekeeping or managing your Housekeeping? N -  Some recent data might be hidden    Patient Care Team: Dorothyann Peng, MD as PCP - General (Internal Medicine) Dorothyann Peng, MD (Internal Medicine)  Indicate any recent Medical Services you may have received from other than [facility] providers in the past year (date may be approximate).     Assessment:   This is a routine wellness examination for Nishtha.  Hearing/Vision screen Vision Screening - Comments:: Regular eye exams, DR. Tanner  Dietary issues and exercise activities discussed: Current Exercise Habits: Home exercise routine, Type of exercise: walking, Time (Minutes): 45, Frequency (Times/Week): 3, Weekly Exercise (Minutes/Week): 135   Goals Addressed             This Visit's Progress    Patient Stated       03/28/2021, lose 10 pounds by birthday       Depression Screen PHQ 2/9 Scores 03/28/2021 10/31/2020 08/17/2019 07/05/2019 02/14/2019 08/24/2018  PHQ - 2  Score 0 0 0 0 0 0    Fall Risk Fall Risk  03/28/2021 10/31/2020 08/17/2019 07/05/2019 02/14/2019  Falls in the past year? 0 0 0 0 0  Risk for fall due to : Medication side effect - - - -  Follow up Falls evaluation completed;Education provided;Falls prevention discussed - - - -    FALL RISK PREVENTION PERTAINING TO THE HOME:  Any stairs in or around the home? No  If so, are there any without handrails?  N/a Home free of loose throw rugs in walkways, pet beds, electrical cords, etc? Yes  Adequate lighting in your home to reduce risk of falls? Yes   ASSISTIVE DEVICES UTILIZED TO PREVENT FALLS:  Life alert? No  Use of a cane, walker or w/c? No  Grab bars in the bathroom? Yes  Shower chair or bench in shower? No  Elevated toilet seat or a handicapped toilet? No   TIMED UP AND GO:  Was the test performed? No .      Cognitive Function:     6CIT Screen 03/28/2021 03/26/2020  What Year? 0 points 0 points  What month? 0 points 0 points  What time? 0 points 0 points  Count back from 20 0 points 2 points  Months in reverse 0 points 0 points  Repeat phrase 0 points 0 points  Total Score 0 2    Immunizations Immunization History  Administered Date(s) Administered   DTaP 04/19/2012   Influenza Inj Mdck Quad Pf 06/21/2019   Influenza Nasal 06/24/2018   Influenza, High Dose Seasonal PF 07/17/2020   Influenza,inj,Quad PF,6+ Mos 07/02/2018   Influenza-Unspecified 07/02/2018, 06/21/2019   PFIZER Comirnaty(Gray Top)Covid-19 Tri-Sucrose Vaccine 12/25/2020   PFIZER(Purple Top)SARS-COV-2 Vaccination 10/04/2019, 10/25/2019, 05/22/2020   PNEUMOCOCCAL CONJUGATE-20 11/12/2020   Pneumococcal Conjugate-13 07/05/2019   Pneumococcal-Unspecified 11/12/2020   Zoster Recombinat (Shingrix) 08/03/2020    TDAP status: Up to date  Flu Vaccine status: Up to date  Pneumococcal vaccine status: Up to date  Covid-19 vaccine status: Completed vaccines  Qualifies for Shingles Vaccine? Yes    Zostavax completed No   Shingrix Completed?: needs second dose  Screening Tests Health Maintenance  Topic Date Due   Zoster Vaccines- Shingrix (2 of 2) 09/28/2020   FOOT EXAM  03/26/2021   INFLUENZA VACCINE  04/15/2021   COVID-19  Vaccine (5 - Booster for Pfizer series) 04/26/2021   OPHTHALMOLOGY EXAM  07/04/2021   HEMOGLOBIN A1C  08/29/2021   MAMMOGRAM  09/20/2021   TETANUS/TDAP  04/19/2022   COLONOSCOPY (Pts 45-42yrs Insurance coverage will need to be confirmed)  08/02/2027   DEXA SCAN  Completed   Hepatitis C Screening  Completed   PNA vac Low Risk Adult  Completed   HPV VACCINES  Aged Out    Health Maintenance  Health Maintenance Due  Topic Date Due   Zoster Vaccines- Shingrix (2 of 2) 09/28/2020   FOOT EXAM  03/26/2021    Colorectal cancer screening: Type of screening: Colonoscopy. Completed 08/01/2017. Repeat every 10 years  Mammogram status: Completed 03/26/2021. Repeat every year  Bone Density status: Completed 05/20/2018.   Lung Cancer Screening: (Low Dose CT Chest recommended if Age 49-80 years, 30 pack-year currently smoking OR have quit w/in 15years.) does not qualify.   Lung Cancer Screening Referral: no  Additional Screening:  Hepatitis C Screening: does qualify; Completed 09/01/2012  Vision Screening: Recommended annual ophthalmology exams for early detection of glaucoma and other disorders of the eye. Is the patient up to date with their annual eye exam?  Yes  Who is the provider or what is the name of the office in which the patient attends annual eye exams? Dr. Burgess Estelle If pt is not established with a provider, would they like to be referred to a provider to establish care? No .   Dental Screening: Recommended annual dental exams for proper oral hygiene  Community Resource Referral / Chronic Care Management: CRR required this visit?  No   CCM required this visit?  No      Plan:     I have personally reviewed and noted the following in the  patient's chart:   Medical and social history Use of alcohol, tobacco or illicit drugs  Current medications and supplements including opioid prescriptions.  Functional ability and status Nutritional status Physical activity Advanced directives List of other physicians Hospitalizations, surgeries, and ER visits in previous 12 months Vitals Screenings to include cognitive, depression, and falls Referrals and appointments  In addition, I have reviewed and discussed with patient certain preventive protocols, quality metrics, and best practice recommendations. A written personalized care plan for preventive services as well as general preventive health recommendations were provided to patient.     Barb Merino, LPN   12/09/7122   Nurse Notes:

## 2021-04-22 ENCOUNTER — Other Ambulatory Visit: Payer: Self-pay

## 2021-04-22 ENCOUNTER — Encounter: Payer: Self-pay | Admitting: Internal Medicine

## 2021-04-22 ENCOUNTER — Ambulatory Visit (INDEPENDENT_AMBULATORY_CARE_PROVIDER_SITE_OTHER): Payer: Medicare Other | Admitting: Internal Medicine

## 2021-04-22 VITALS — BP 124/80 | HR 84 | Temp 98.7°F | Ht 60.0 in | Wt 180.8 lb

## 2021-04-22 DIAGNOSIS — Z23 Encounter for immunization: Secondary | ICD-10-CM

## 2021-04-22 DIAGNOSIS — E7800 Pure hypercholesterolemia, unspecified: Secondary | ICD-10-CM | POA: Diagnosis not present

## 2021-04-22 MED ORDER — ZOSTER VAC RECOMB ADJUVANTED 50 MCG/0.5ML IM SUSR: 0.5000 mL | Freq: Once | INTRAMUSCULAR | 0 refills | Status: AC

## 2021-04-22 NOTE — Patient Instructions (Signed)

## 2021-04-22 NOTE — Progress Notes (Signed)
I,Yamilka Roman [employer] as a Neurosurgeon for [name], MD.,have documented all relevant documentation on the behalf of [name], MD,as directed by  [name], MD while in the presence of [name], MD.  This visit occurred during the SARS-CoV-2 public health emergency.  Safety protocols were in place, including screening questions prior to the visit, additional usage of staff PPE, and extensive cleaning of exam room while observing appropriate contact time as indicated for disinfecting solutions.  Subjective:     Patient ID: Tanya Levy , female    DOB: [birth year] , 70 y.o.   MRN: [id number]   Chief Complaint  Patient presents with   Hyperlipidemia    HPI  Patient is here for a cholesterol check. She was not able to tolerate atorvastatin due to myalgias. She has not been able to tolerate generic rosuvastatin.  She has tolerated Brand name Crestor in the past. Unfortunately, her insurance is no longer paying ofr hits.   Hyperlipidemia Compliance problems include medication side effects.  Risk factors for coronary artery disease include diabetes mellitus, hypertension, dyslipidemia, post-menopausal and a sedentary lifestyle.    Past Medical History:  Diagnosis Date   Diabetes mellitus (HCC)    Diabetes mellitus without complication (HCC)    History of chicken pox    History of measles    History of mumps    Hypertension    Tilted uterus    UTI (urinary tract infection)      Family History  Problem Relation Age of Onset   Hypertension Mother    Hypertension Father      Current Outpatient Medications:    aspirin 81 MG EC tablet, Take 81 mg by mouth daily. Swallow whole., Disp: , Rfl:    b complex vitamins tablet, Take 1 tablet by mouth daily. 3 times per week, Disp: , Rfl:    diphenhydrAMINE (BENADRYL) 25 MG tablet, Take 1 tablet (25 mg total) by mouth every 6 (six) hours as needed for itching or allergies (Rash)., Disp: 30 tablet, Rfl: 0    dorzolamide-timolol (COSOPT) 22.3-6.8 MG/ML ophthalmic solution, SMARTSIG:In Eye(s), Disp: , Rfl:    EPINEPHrine 0.3 mg/0.3 mL IJ SOAJ injection, EpiPen 2-Pak 0.3 mg/0.3 mL injection, auto-injector, Disp: , Rfl:    fluticasone (FLONASE) 50 MCG/ACT nasal spray, SPRAY 2 SPRAYS INTO EACH NOSTRIL EVERY DAY, Disp: 48 mL, Rfl: 1   latanoprost (XALATAN) 0.005 % ophthalmic solution, , Disp: , Rfl:    levothyroxine (SYNTHROID) 100 MCG tablet, TAKE 1 TABLET BY MOUTH EVERY DAY, Disp: 90 tablet, Rfl: 1   OZEMPIC, 1 MG/DOSE, 4 MG/3ML SOPN, INJECT 1 MG INTO THE SKIN ONCE A WEEK., Disp: 3 mL, Rfl: 3   valsartan-hydrochlorothiazide (DIOVAN-HCT) 160-12.5 MG tablet, TAKE 1 TABLET BY MOUTH EVERY DAY, Disp: 90 tablet, Rfl: 2   scopolamine (TRANSDERM-SCOP, 1.5 MG,) 1 MG/3DAYS, Place 1 patch (1.5 mg total) onto the skin every 3 (three) days. prn (Patient not taking: No sig reported), Disp: 4 patch, Rfl: 1   Allergies  Allergen Reactions   Shellfish Allergy Anaphylaxis   Shellfish Allergy      Review of Systems  Constitutional: Negative.   Respiratory: Negative.    Cardiovascular: Negative.   Gastrointestinal: Negative.   Neurological: Negative.   Psychiatric/Behavioral: Negative.      Today's Vitals   04/22/21 1421  BP: 124/80  Pulse: 84  Temp: 98.7 F (37.1 C)  Weight: 180 lb 12.8 oz (82 kg)  Height: 5' (1.524 m)  PainSc:  0-No pain   Body mass index is 35.31 kg/m.   Objective:  Physical Exam Vitals and nursing note reviewed.  Constitutional:      Appearance: Normal appearance.  HENT:     Head: Normocephalic and atraumatic.     Nose:     Comments: Masked     Mouth/Throat:     Comments: Masked  Eyes:     Extraocular Movements: Extraocular movements intact.  Cardiovascular:     Rate and Rhythm: Normal rate and regular rhythm.     Heart sounds: Normal heart sounds.  Pulmonary:     Effort: Pulmonary effort is normal.     Breath sounds: Normal breath sounds.  Musculoskeletal:      Cervical back: Normal range of motion.  Skin:    General: Skin is warm.  Neurological:     General: No focal deficit present.     Mental Status: She is alert.  Psychiatric:        Mood and Affect: Mood normal.        Behavior: Behavior normal.        Assessment And Plan:     1. Pure hypercholesterolemia Comments: I will send rx Crestor to her local pharmacy. If not approved, she agrees to [facility] referral.   2. Immunization due Comments: I will send rx Shingrix to her local pharmacy.  - Zoster Vaccine Adjuvanted [facility]) injection; Inject 0.5 mLs into the muscle once for 1 dose.  Dispense: 0.5 mL; Refill: 0     Patient was given opportunity to ask questions. Patient verbalized understanding of the plan and was able to repeat key elements of the plan. All questions were answered to their satisfaction.   I, [name], MD, have reviewed all documentation for this visit. The documentation on 04/28/21 for the exam, diagnosis, procedures, and orders are all accurate and complete.   IF YOU HAVE BEEN REFERRED TO A SPECIALIST, IT MAY TAKE 1-2 WEEKS TO SCHEDULE/PROCESS THE REFERRAL. IF YOU HAVE NOT HEARD FROM US/SPECIALIST IN TWO WEEKS, PLEASE GIVE [place] A CALL AT [phone].   THE PATIENT IS ENCOURAGED TO PRACTICE SOCIAL DISTANCING DUE TO THE COVID-19 PANDEMIC.

## 2021-04-26 ENCOUNTER — Other Ambulatory Visit: Payer: Self-pay | Admitting: Internal Medicine

## 2021-04-28 MED ORDER — CRESTOR 20 MG PO TABS: 20.0000 mg | ORAL_TABLET | Freq: Every day | ORAL | 11 refills | Status: DC

## 2021-04-30 ENCOUNTER — Telehealth: Payer: Self-pay

## 2021-04-30 NOTE — Telephone Encounter (Signed)
Pa completed for crestor

## 2021-05-06 ENCOUNTER — Telehealth: Payer: Self-pay

## 2021-05-06 NOTE — Telephone Encounter (Signed)
Patient notified that her PA for Crestor has been approved through 04/30/2024 [facility]

## 2021-05-14 LAB — HM DIABETES EYE EXAM

## 2021-05-17 ENCOUNTER — Encounter: Payer: Self-pay | Admitting: Internal Medicine

## 2021-06-22 ENCOUNTER — Other Ambulatory Visit: Payer: Self-pay | Admitting: Internal Medicine

## 2021-06-27 ENCOUNTER — Ambulatory Visit (INDEPENDENT_AMBULATORY_CARE_PROVIDER_SITE_OTHER): Payer: Medicare Other | Admitting: Internal Medicine

## 2021-06-27 ENCOUNTER — Other Ambulatory Visit: Payer: Self-pay

## 2021-06-27 ENCOUNTER — Encounter: Payer: Self-pay | Admitting: Internal Medicine

## 2021-06-27 VITALS — BP 122/68 | HR 87 | Temp 98.1°F | Ht 58.6 in | Wt 180.2 lb

## 2021-06-27 DIAGNOSIS — Z79899 Other long term (current) drug therapy: Secondary | ICD-10-CM

## 2021-06-27 DIAGNOSIS — E1165 Type 2 diabetes mellitus with hyperglycemia: Secondary | ICD-10-CM | POA: Diagnosis not present

## 2021-06-27 DIAGNOSIS — T7802XS Anaphylactic reaction due to shellfish (crustaceans), sequela: Secondary | ICD-10-CM

## 2021-06-27 DIAGNOSIS — I10 Essential (primary) hypertension: Secondary | ICD-10-CM

## 2021-06-27 DIAGNOSIS — Z91013 Allergy to seafood: Secondary | ICD-10-CM

## 2021-06-27 DIAGNOSIS — Z6836 Body mass index (BMI) 36.0-36.9, adult: Secondary | ICD-10-CM

## 2021-06-27 NOTE — Patient Instructions (Signed)
Diabetes Mellitus and Nutrition, Adult When you have diabetes, or diabetes mellitus, it is very important to have healthy eating habits because your blood sugar (glucose) levels are greatly affected by what you eat and drink. Eating healthy foods in the right amounts, at about the same times every day, can help you:  Control your blood glucose.  Lower your risk of heart disease.  Improve your blood pressure.  Reach or maintain a healthy weight. What can affect my meal plan? Every person with diabetes is different, and each person has different needs for a meal plan. Your health care provider may recommend that you work with a dietitian to make a meal plan that is best for you. Your meal plan may vary depending on factors such as:  The calories you need.  The medicines you take.  Your weight.  Your blood glucose, blood pressure, and cholesterol levels.  Your activity level.  Other health conditions you have, such as heart or kidney disease. How do carbohydrates affect me? Carbohydrates, also called carbs, affect your blood glucose level more than any other type of food. Eating carbs naturally raises the amount of glucose in your blood. Carb counting is a method for keeping track of how many carbs you eat. Counting carbs is important to keep your blood glucose at a healthy level, especially if you use insulin or take certain oral diabetes medicines. It is important to know how many carbs you can safely have in each meal. This is different for every person. Your dietitian can help you calculate how many carbs you should have at each meal and for each snack. How does alcohol affect me? Alcohol can cause a sudden decrease in blood glucose (hypoglycemia), especially if you use insulin or take certain oral diabetes medicines. Hypoglycemia can be a life-threatening condition. Symptoms of hypoglycemia, such as sleepiness, dizziness, and confusion, are similar to symptoms of having too much  alcohol.  Do not drink alcohol if: ? Your health care provider tells you not to drink. ? You are pregnant, may be pregnant, or are planning to become pregnant.  If you drink alcohol: ? Do not drink on an empty stomach. ? Limit how much you use to:  0-1 drink a day for women.  0-2 drinks a day for men. ? Be aware of how much alcohol is in your drink. In the [place] one drink equals one 12 oz bottle of beer (355 mL), one 5 oz glass of wine (148 mL), or one 1 oz glass of hard liquor (44 mL). ? Keep yourself hydrated with water, diet soda, or unsweetened iced tea.  Keep in mind that regular soda, juice, and other mixers may contain a lot of sugar and must be counted as carbs. What are tips for following this plan? Reading food labels  Start by checking the serving size on the "Nutrition Facts" label of packaged foods and drinks. The amount of calories, carbs, fats, and other nutrients listed on the label is based on one serving of the item. Many items contain more than one serving per package.  Check the total grams (g) of carbs in one serving. You can calculate the number of servings of carbs in one serving by dividing the total carbs by 15. For example, if a food has 30 g of total carbs per serving, it would be equal to 2 servings of carbs.  Check the number of grams (g) of saturated fats and trans fats in one serving. Choose foods that have   a low amount or none of these fats.  Check the number of milligrams (mg) of salt (sodium) in one serving. Most people should limit total sodium intake to less than 2,300 mg per day.  Always check the nutrition information of foods labeled as "low-fat" or "nonfat." These foods may be higher in added sugar or refined carbs and should be avoided.  Talk to your dietitian to identify your daily goals for nutrients listed on the label. Shopping  Avoid buying canned, pre-made, or processed foods. These foods tend to be high in fat, sodium, and added  sugar.  Shop around the outside edge of the grocery store. This is where you will most often find fresh fruits and vegetables, bulk grains, fresh meats, and fresh dairy. Cooking  Use low-heat cooking methods, such as baking, instead of high-heat cooking methods like deep frying.  Cook using healthy oils, such as olive, canola, or sunflower oil.  Avoid cooking with butter, cream, or high-fat meats. Meal planning  Eat meals and snacks regularly, preferably at the same times every day. Avoid going long periods of time without eating.  Eat foods that are high in fiber, such as fresh fruits, vegetables, beans, and whole grains. Talk with your dietitian about how many servings of carbs you can eat at each meal.  Eat 4-6 oz (112-168 g) of lean protein each day, such as lean meat, chicken, fish, eggs, or tofu. One ounce (oz) of lean protein is equal to: ? 1 oz (28 g) of meat, chicken, or fish. ? 1 egg. ?  cup (62 g) of tofu.  Eat some foods each day that contain healthy fats, such as avocado, nuts, seeds, and fish.   What foods should I eat? Fruits Berries. Apples. Oranges. Peaches. Apricots. Plums. Grapes. Mango. Papaya. Pomegranate. Kiwi. Cherries. Vegetables Lettuce. Spinach. Leafy greens, including kale, chard, collard greens, and mustard greens. Beets. Cauliflower. Cabbage. Broccoli. Carrots. Green beans. Tomatoes. Peppers. Onions. Cucumbers. Brussels sprouts. Grains Whole grains, such as whole-wheat or whole-grain bread, crackers, tortillas, cereal, and pasta. Unsweetened oatmeal. Quinoa. Brown or wild rice. Meats and other proteins Seafood. Poultry without skin. Lean cuts of poultry and beef. Tofu. Nuts. Seeds. Dairy Low-fat or fat-free dairy products such as milk, yogurt, and cheese. The items listed above may not be a complete list of foods and beverages you can eat. Contact a dietitian for more information. What foods should I avoid? Fruits Fruits canned with  syrup. Vegetables Canned vegetables. Frozen vegetables with butter or cream sauce. Grains Refined white flour and flour products such as bread, pasta, snack foods, and cereals. Avoid all processed foods. Meats and other proteins Fatty cuts of meat. Poultry with skin. Breaded or fried meats. Processed meat. Avoid saturated fats. Dairy Full-fat yogurt, cheese, or milk. Beverages Sweetened drinks, such as soda or iced tea. The items listed above may not be a complete list of foods and beverages you should avoid. Contact a dietitian for more information. Questions to ask a health care provider  Do I need to meet with a diabetes educator?  Do I need to meet with a dietitian?  What number can I call if I have questions?  When are the best times to check my blood glucose? Where to find more information:  American Diabetes Association: diabetes.org  Academy of Nutrition and Dietetics: www.eatright.org  National Institute of Diabetes and Digestive and Kidney Diseases: www.niddk.nih.gov  Association of Diabetes Care and Education Specialists: www.diabeteseducator.org Summary  It is important to have healthy eating   habits because your blood sugar (glucose) levels are greatly affected by what you eat and drink.  A healthy meal plan will help you control your blood glucose and maintain a healthy lifestyle.  Your health care provider may recommend that you work with a dietitian to make a meal plan that is best for you.  Keep in mind that carbohydrates (carbs) and alcohol have immediate effects on your blood glucose levels. It is important to count carbs and to use alcohol carefully. This information is not intended to replace advice given to you by your health care provider. Make sure you discuss any questions you have with your health care provider. Document Revised: 08/09/2019 Document Reviewed: 08/09/2019 Elsevier Patient Education  2021 Elsevier Inc.  

## 2021-06-27 NOTE — Progress Notes (Signed)
I,[name],acting as a Education administrator for [name], MD.,have documented all relevant documentation on the behalf of [name], MD,as directed by  [name], MD while in the presence of [name], MD.  This visit occurred during the SARS-CoV-2 public health emergency.  Safety protocols were in place, including screening questions prior to the visit, additional usage of staff PPE, and extensive cleaning of exam room while observing appropriate contact time as indicated for disinfecting solutions.  Subjective:     Patient ID: Tanya Levy , female    DOB: 1951-06-12 , 70 y.o.   MRN: [id number]   Chief Complaint  Patient presents with  . Diabetes  . Hypertension    HPI  Patient is here for diabetes, blood pressure check. She reports compliance with meds. She has declined the flu shot. She states that it costs her to take it here but it is free at [facility].  Diabetes She presents for her follow-up diabetic visit. She has type 2 diabetes mellitus. Her disease course has been improving. There are no hypoglycemic associated symptoms. Pertinent negatives for diabetes include no fatigue. There are no hypoglycemic complications. Risk factors for coronary artery disease include diabetes mellitus, dyslipidemia, hypertension, obesity, sedentary lifestyle and post-menopausal. She is compliant with treatment most of the time. She is following a diabetic diet. She participates in exercise intermittently. Her breakfast blood glucose is taken between 8-9 am. Her breakfast blood glucose range is generally 110-130 mg/dl. An ACE inhibitor/angiotensin II receptor blocker is being taken. Eye exam is current.  Hypertension This is a chronic problem. The current episode started more than 1 year ago. The problem has been gradually improving since onset. The problem is controlled. Risk factors for coronary artery disease include diabetes mellitus, dyslipidemia, obesity, sedentary lifestyle and  post-menopausal state. Compliance problems include exercise.     Past Medical History:  Diagnosis Date  . Diabetes mellitus ([place])   . Diabetes mellitus without complication ([place])   . History of chicken pox   . History of measles   . History of mumps   . Hypertension   . Tilted uterus   . UTI (urinary tract infection)      Family History  Problem Relation Age of Onset  . Hypertension Mother   . Hypertension Father      Current Outpatient Medications:  .  valsartan-hydrochlorothiazide (DIOVAN-HCT) 160-12.5 MG tablet, TAKE 1 TABLET BY MOUTH EVERY DAY, Disp: 90 tablet, Rfl: 2 .  aspirin 81 MG EC tablet, Take 81 mg by mouth daily. Swallow whole., Disp: , Rfl:  .  b complex vitamins tablet, Take 1 tablet by mouth daily. 3 times per week, Disp: , Rfl:  .  CRESTOR 20 MG tablet, Take 1 tablet (20 mg total) by mouth daily., Disp: 30 tablet, Rfl: 11 .  diphenhydrAMINE (BENADRYL) 25 MG tablet, Take 1 tablet (25 mg total) by mouth every 6 (six) hours as needed for itching or allergies (Rash)., Disp: 30 tablet, Rfl: 0 .  dorzolamide-timolol (COSOPT) 22.3-6.8 MG/ML ophthalmic solution, SMARTSIG:In Eye(s), Disp: , Rfl:  .  EPINEPHrine 0.3 mg/0.3 mL IJ SOAJ injection, EpiPen 2-Pak 0.3 mg/0.3 mL injection, auto-injector, Disp: , Rfl:  .  fluticasone (FLONASE) 50 MCG/ACT nasal spray, SPRAY 2 SPRAYS INTO EACH NOSTRIL EVERY DAY, Disp: 48 mL, Rfl: 1 .  latanoprost (XALATAN) 0.005 % ophthalmic solution, , Disp: , Rfl:  .  levothyroxine (SYNTHROID) 100 MCG tablet, TAKE 1 TABLET BY MOUTH EVERY DAY, Disp: 90 tablet, Rfl:  1 .  OZEMPIC, 1 MG/DOSE, 4 MG/3ML SOPN, INJECT 1 MG INTO THE SKIN ONCE A WEEK., Disp: 3 mL, Rfl: 3   Allergies  Allergen Reactions  . Shellfish Allergy Anaphylaxis  . Shellfish Allergy      Review of Systems  Constitutional: Negative.  Negative for fatigue.  Respiratory: Negative.    Cardiovascular: Negative.   Gastrointestinal: Negative.   Allergic/Immunologic:       States she  had allergic reaction to shrimp while eating at [employer] 3 weeks ago on a Tuesday. Unfortunately, she did not have her Epipen on her. Her husband took her to urgent care and she was given Prednisone and Benadryl.   Neurological: Negative.     Today's Vitals   06/27/21 0902  BP: 122/68  Pulse: 87  Temp: 98.1 F (36.7 C)  TempSrc: Oral  Weight: 180 lb 3.2 oz (81.7 kg)  Height: 4' 10.6" (1.488 m)   Body mass index is 36.89 kg/m.  Wt Readings from Last 3 Encounters:  06/27/21 180 lb 3.2 oz (81.7 kg)  04/22/21 180 lb 12.8 oz (82 kg)  03/28/21 178 lb (80.7 kg)    Objective:  Physical Exam Vitals and nursing note reviewed.  Constitutional:      Appearance: Normal appearance.  HENT:     Head: Normocephalic and atraumatic.     Nose:     Comments: Masked     Mouth/Throat:     Comments: Masked  Cardiovascular:     Rate and Rhythm: Normal rate and regular rhythm.     Pulses:          Dorsalis pedis pulses are 2+ on the right side and 2+ on the left side.     Heart sounds: Normal heart sounds.  Pulmonary:     Effort: Pulmonary effort is normal.     Breath sounds: Normal breath sounds.  Feet:     Right foot:     Protective Sensation: 5 sites tested.  5 sites sensed.     Skin integrity: Skin integrity normal.     Toenail Condition: Right toenails are normal.     Left foot:     Protective Sensation: 5 sites tested.  5 sites sensed.     Skin integrity: Skin integrity normal.     Toenail Condition: Left toenails are normal.  Skin:    General: Skin is warm.  Neurological:     General: No focal deficit present.     Mental Status: She is alert.  Psychiatric:        Mood and Affect: Mood normal.        Behavior: Behavior normal.        Assessment And Plan:     1. Uncontrolled type 2 diabetes mellitus with hyperglycemia ([place]) Comments: Diabetic foot exam was performed.  I will check labs as listed below. Encouraged to follow dietary guidelines. Advised to exercise at least  150 min/week.  - Hemoglobin A1c - BMP8+EGFR - Lipid panel  2. Essential hypertension, benign Comments: Chronic, well controlled. Encouraged to follow low sodium diet. Encouraged to incorporate more exercise into her dailoy routine.  - CBC no Diff  3. 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   4. Class 2 severe obesity due to excess calories with serious comorbidity and body mass index (BMI) of 36.0 to 36.9 in adult [facility]) Comments: She is encouraged to strive for BMI less than 30 to decrease cardiac  risk. Advised to aim for at least 150 minutes of exercise per week.   5. Shellfish allergy  6. Drug therapy - Vitamin B12  Patient was given opportunity to ask questions. Patient verbalized understanding of the plan and was able to repeat key elements of the plan. All questions were answered to their satisfaction.   I, [name], MD, have reviewed all documentation for this visit. The documentation on 06/29/21 for the exam, diagnosis, procedures, and orders are all accurate and complete.   IF YOU HAVE BEEN REFERRED TO A SPECIALIST, IT MAY TAKE 1-2 WEEKS TO SCHEDULE/PROCESS THE REFERRAL. IF YOU HAVE NOT HEARD FROM US/SPECIALIST IN TWO WEEKS, PLEASE GIVE [place] A CALL AT [phone].   THE PATIENT IS ENCOURAGED TO PRACTICE SOCIAL DISTANCING DUE TO THE COVID-19 PANDEMIC.

## 2021-06-28 LAB — CBC
Hematocrit: 36.2 % (ref 34.0–46.6)
Hemoglobin: 12.1 g/dL (ref 11.1–15.9)
MCH: 27.3 pg (ref 26.6–33.0)
MCHC: 33.4 g/dL (ref 31.5–35.7)
MCV: 82 fL (ref 79–97)
Platelets: 252 10*3/uL (ref 150–450)
RBC: 4.43 x10E6/uL (ref 3.77–5.28)
RDW: 14.2 % (ref 11.7–15.4)
WBC: 5.8 10*3/uL (ref 3.4–10.8)

## 2021-06-28 LAB — BMP8+EGFR
BUN/Creatinine Ratio: 19 (ref 12–28)
BUN: 19 mg/dL (ref 8–27)
CO2: 24 mmol/L (ref 20–29)
Calcium: 10 mg/dL (ref 8.7–10.3)
Chloride: 102 mmol/L (ref 96–106)
Creatinine, Ser: 0.99 mg/dL (ref 0.57–1.00)
Glucose: 140 mg/dL — ABNORMAL HIGH (ref 70–99)
Potassium: 4.7 mmol/L (ref 3.5–5.2)
Sodium: 142 mmol/L (ref 134–144)
eGFR: 62 mL/min/{1.73_m2} (ref >59)

## 2021-06-28 LAB — LIPID PANEL
Chol/HDL Ratio: 3.8 ratio (ref 0.0–4.4)
Cholesterol, Total: 155 mg/dL (ref 100–199)
HDL: 41 mg/dL (ref >39)
LDL Chol Calc (NIH): 90 mg/dL (ref 0–99)
Triglycerides: 133 mg/dL (ref 0–149)
VLDL Cholesterol Cal: 24 mg/dL (ref 5–40)

## 2021-06-28 LAB — VITAMIN B12: Vitamin B-12: 1006 pg/mL (ref 232–1245)

## 2021-06-28 LAB — HEMOGLOBIN A1C
Est. average glucose Bld gHb Est-mCnc: 180 mg/dL
Hgb A1c MFr Bld: 7.9 % — ABNORMAL HIGH (ref 4.8–5.6)

## 2021-07-15 ENCOUNTER — Other Ambulatory Visit: Payer: Self-pay

## 2021-07-15 MED ORDER — DAPAGLIFLOZIN PROPANEDIOL 10 MG PO TABS: 10.0000 mg | ORAL_TABLET | Freq: Every day | ORAL | 1 refills | Status: DC

## 2021-07-19 ENCOUNTER — Other Ambulatory Visit: Payer: Self-pay | Admitting: Internal Medicine

## 2021-07-30 ENCOUNTER — Ambulatory Visit: Payer: Medicare Other | Admitting: Nurse Practitioner

## 2021-07-31 ENCOUNTER — Other Ambulatory Visit: Payer: Self-pay

## 2021-07-31 ENCOUNTER — Ambulatory Visit (INDEPENDENT_AMBULATORY_CARE_PROVIDER_SITE_OTHER): Payer: Medicare Other | Admitting: Nurse Practitioner

## 2021-07-31 ENCOUNTER — Encounter: Payer: Self-pay | Admitting: Nurse Practitioner

## 2021-07-31 VITALS — BP 126/70 | HR 97 | Temp 98.2°F | Ht 58.2 in | Wt 182.0 lb

## 2021-07-31 DIAGNOSIS — Z23 Encounter for immunization: Secondary | ICD-10-CM | POA: Diagnosis not present

## 2021-07-31 DIAGNOSIS — R39198 Other difficulties with micturition: Secondary | ICD-10-CM

## 2021-07-31 DIAGNOSIS — E1165 Type 2 diabetes mellitus with hyperglycemia: Secondary | ICD-10-CM

## 2021-07-31 LAB — POCT URINALYSIS DIPSTICK
Bilirubin, UA: NEGATIVE
Glucose, UA: NEGATIVE
Ketones, UA: NEGATIVE
Leukocytes, UA: NEGATIVE
Nitrite, UA: NEGATIVE
Protein, UA: NEGATIVE
Spec Grav, UA: 1.025 (ref 1.010–1.025)
Urobilinogen, UA: 0.2 E.U./dL
pH, UA: 5.5 (ref 5.0–8.0)

## 2021-07-31 NOTE — Addendum Note (Signed)
Addended by: Delma Officer on: 07/31/2021 01:48 PM   Modules accepted: Orders

## 2021-07-31 NOTE — Progress Notes (Signed)
I,[name],acting as a Education administrator for [employer], NP.,have documented all relevant documentation on the behalf of [employer], NP,as directed by  Bary Castilla, NP while in the presence of Bary Castilla, NP.  This visit occurred during the SARS-CoV-2 public health emergency.  Safety protocols were in place, including screening questions prior to the visit, additional usage of staff PPE, and extensive cleaning of exam room while observing appropriate contact time as indicated for disinfecting solutions.  Subjective:     Patient ID: Tanya Levy , female    DOB: 26-Jun-1951 , 70 y.o.   MRN: [id number]   Chief Complaint  Patient presents with   Diabetes    HPI  The patient is here today for a diabetes f/u, the pt was recently started on Farxiga 10 mg and feels like her kidneys aren't working like they were prior to starting the medication. Tanya Levy had been taking ozempic and Dr. Baird Cancer had added farsiga and since then her urine started to decrease. Tanya Levy is using the bathroom 4-5 times a day.  Wt Readings from Last 3 Encounters: 07/31/21 : 183 lb 9.6 oz (83.3 kg) 06/27/21 : 180 lb 3.2 oz (81.7 kg) 04/22/21 : 180 lb 12.8 oz (82 kg)    Diabetes Pertinent negatives for hypoglycemia include no headaches. Pertinent negatives for diabetes include no chest pain and no weakness.    Past Medical History:  Diagnosis Date   Diabetes mellitus ([place])    Diabetes mellitus without complication ([place])    History of chicken pox    History of measles    History of mumps    Hypertension    Tilted uterus    UTI (urinary tract infection)      Family History  Problem Relation Age of Onset   Hypertension Mother    Hypertension Father      Current Outpatient Medications:    aspirin 81 MG EC tablet, Take 81 mg by mouth daily. Swallow whole., Disp: , Rfl:    b complex vitamins tablet, Take 1 tablet by mouth daily. 3 times per week, Disp: , Rfl:    CRESTOR 20 MG tablet,  Take 1 tablet (20 mg total) by mouth daily., Disp: 30 tablet, Rfl: 11   diphenhydrAMINE (BENADRYL) 25 MG tablet, Take 1 tablet (25 mg total) by mouth every 6 (six) hours as needed for itching or allergies (Rash)., Disp: 30 tablet, Rfl: 0   dorzolamide-timolol (COSOPT) 22.3-6.8 MG/ML ophthalmic solution, SMARTSIG:In Eye(s), Disp: , Rfl:    EPINEPHrine 0.3 mg/0.3 mL IJ SOAJ injection, EpiPen 2-Pak 0.3 mg/0.3 mL injection, auto-injector, Disp: , Rfl:    fluticasone (FLONASE) 50 MCG/ACT nasal spray, SPRAY 2 SPRAYS INTO EACH NOSTRIL EVERY DAY, Disp: 48 mL, Rfl: 1   latanoprost (XALATAN) 0.005 % ophthalmic solution, , Disp: , Rfl:    levothyroxine (SYNTHROID) 100 MCG tablet, TAKE 1 TABLET BY MOUTH EVERY DAY, Disp: 90 tablet, Rfl: 1   OZEMPIC, 1 MG/DOSE, 4 MG/3ML SOPN, INJECT 1MG INTO THE SKIN ONCE A WEEK, Disp: 3 mL, Rfl: 2   valsartan-hydrochlorothiazide (DIOVAN-HCT) 160-12.5 MG tablet, TAKE 1 TABLET BY MOUTH EVERY DAY, Disp: 90 tablet, Rfl: 2   dapagliflozin propanediol (FARXIGA) 10 MG TABS tablet, Take 1 tablet (10 mg total) by mouth daily before breakfast. (Patient not taking: Reported on 07/31/2021), Disp: 90 tablet, Rfl: 1   Allergies  Allergen Reactions   Shellfish Allergy Anaphylaxis   Shellfish Allergy      Review of Systems  Constitutional: Negative.  Negative for chills  and fever.  HENT:  Negative for congestion.   Respiratory: Negative.  Negative for shortness of breath and wheezing.   Cardiovascular: Negative.  Negative for chest pain and palpitations.  Gastrointestinal: Negative.   Genitourinary:  Positive for decreased urine volume and difficulty urinating. Negative for vaginal discharge.  Neurological:  Negative for weakness and headaches.  Psychiatric/Behavioral: Negative.    All other systems reviewed and are negative.   Today's Vitals   07/31/21 1100  BP: 126/70  Pulse: (!) 106  Temp: 98.2 F (36.8 C)  Weight: 183 lb 9.6 oz (83.3 kg)  Height: 4' 10.2" (1.478 m)   PainSc: 0-No pain   Body mass index is 38.11 kg/m.  Wt Readings from Last 3 Encounters:  07/31/21 183 lb 9.6 oz (83.3 kg)  06/27/21 180 lb 3.2 oz (81.7 kg)  04/22/21 180 lb 12.8 oz (82 kg)    BP Readings from Last 3 Encounters:  07/31/21 126/70  06/27/21 122/68  04/22/21 124/80    Objective:  Physical Exam Constitutional:      Appearance: Normal appearance.  HENT:     Head: Normocephalic and atraumatic.  Cardiovascular:     Rate and Rhythm: Normal rate and regular rhythm.     Pulses: Normal pulses.     Heart sounds: Normal heart sounds. No murmur heard. Pulmonary:     Effort: Pulmonary effort is normal. No respiratory distress.     Breath sounds: Normal breath sounds. No wheezing.  Skin:    General: Skin is warm and dry.     Capillary Refill: Capillary refill takes less than 2 seconds.  Neurological:     Mental Status: Tanya Levy is alert.        Assessment And Plan:     1. Uncontrolled type 2 diabetes mellitus with hyperglycemia ([place]) -Tanya Levy no longer takes [place]  -Tanya Levy is still taking ozempic  -Will check her kidney functions today as that is her concern since Tanya Levy has been taking farsiga  - CMP14+EGFR  2. Difficulty urinating - POCT Urinalysis Dipstick (81002) - CMP14+EGFR  3. Need for influenza vaccination - Flu Vaccine QUAD High Dose(Fluad)   The patient was encouraged to call or send a message through [place] for any questions or concerns.   Follow up: if symptoms persist or do not get better.   Staying healthy and adopting a healthy lifestyle for your overall health is important. You should eat 7 or more servings of fruits and vegetables per day. You should drink plenty of water to keep yourself hydrated and your kidneys healthy. This includes about 65-80+ fluid ounces of water. Limit your intake of animal fats especially for elevated cholesterol. Avoid highly processed food and limit your salt intake if you have hypertension. Avoid foods high in saturated/Trans  fats. Along with a healthy diet it is also very important to maintain time for yourself to maintain a healthy mental health with low stress levels. You should get atleast 150 min of moderate intensity exercise weekly for a healthy heart. Along with eating right and exercising, aim for at least 7-9 hours of sleep daily.  Eat more whole grains which includes barley, wheat berries, oats, brown rice and whole wheat pasta. Use healthy plant oils which include olive, soy, corn, sunflower and peanut. Limit your caffeine and sugary drinks. Limit your intake of fast foods. Limit milk and dairy products to one or two daily servings.   Patient was given opportunity to ask questions. Patient verbalized understanding of the plan and was able  to repeat key elements of the plan. All questions were answered to their satisfaction.  Raman Julita Ozbun, DNP   I, Raman Kjerstin Abrigo have reviewed all documentation for this visit. The documentation on 07/31/21 for the exam, diagnosis, procedures, and orders are all accurate and complete.   IF YOU HAVE BEEN REFERRED TO A SPECIALIST, IT MAY TAKE 1-2 WEEKS TO SCHEDULE/PROCESS THE REFERRAL. IF YOU HAVE NOT HEARD FROM US/SPECIALIST IN TWO WEEKS, PLEASE GIVE [place] A CALL AT [phone].   THE PATIENT IS ENCOURAGED TO PRACTICE SOCIAL DISTANCING DUE TO THE COVID-19 PANDEMIC.

## 2021-07-31 NOTE — Patient Instructions (Signed)

## 2021-08-01 LAB — CMP14+EGFR
ALT: 22 IU/L (ref 0–32)
AST: 17 IU/L (ref 0–40)
Albumin/Globulin Ratio: 1.9 (ref 1.2–2.2)
Albumin: 4.5 g/dL (ref 3.8–4.8)
Alkaline Phosphatase: 76 IU/L (ref 44–121)
BUN/Creatinine Ratio: 20 (ref 12–28)
BUN: 18 mg/dL (ref 8–27)
Bilirubin Total: 0.3 mg/dL (ref 0.0–1.2)
CO2: 23 mmol/L (ref 20–29)
Calcium: 9 mg/dL (ref 8.7–10.3)
Chloride: 103 mmol/L (ref 96–106)
Creatinine, Ser: 0.9 mg/dL (ref 0.57–1.00)
Globulin, Total: 2.4 g/dL (ref 1.5–4.5)
Glucose: 222 mg/dL — ABNORMAL HIGH (ref 70–99)
Potassium: 4.5 mmol/L (ref 3.5–5.2)
Sodium: 141 mmol/L (ref 134–144)
Total Protein: 6.9 g/dL (ref 6.0–8.5)
eGFR: 69 mL/min/{1.73_m2} (ref >59)

## 2021-10-16 ENCOUNTER — Other Ambulatory Visit: Payer: Self-pay | Admitting: Internal Medicine

## 2021-11-06 ENCOUNTER — Encounter: Payer: Self-pay | Admitting: Internal Medicine

## 2021-11-06 ENCOUNTER — Ambulatory Visit (INDEPENDENT_AMBULATORY_CARE_PROVIDER_SITE_OTHER): Payer: Medicare Other | Admitting: Internal Medicine

## 2021-11-06 ENCOUNTER — Other Ambulatory Visit: Payer: Self-pay

## 2021-11-06 VITALS — BP 130/64 | HR 99 | Temp 98.2°F | Ht 58.2 in | Wt 179.4 lb

## 2021-11-06 DIAGNOSIS — J302 Other seasonal allergic rhinitis: Secondary | ICD-10-CM

## 2021-11-06 DIAGNOSIS — Z6837 Body mass index (BMI) 37.0-37.9, adult: Secondary | ICD-10-CM

## 2021-11-06 DIAGNOSIS — E1165 Type 2 diabetes mellitus with hyperglycemia: Secondary | ICD-10-CM

## 2021-11-06 DIAGNOSIS — I10 Essential (primary) hypertension: Secondary | ICD-10-CM | POA: Diagnosis not present

## 2021-11-06 DIAGNOSIS — E039 Hypothyroidism, unspecified: Secondary | ICD-10-CM | POA: Diagnosis not present

## 2021-11-06 MED ORDER — FLUTICASONE PROPIONATE 50 MCG/ACT NA SUSP: NASAL | 2 refills | Status: DC

## 2021-11-06 NOTE — Progress Notes (Signed)
Rich Brave [place],acting as a Education administrator for [name], MD.,have documented all relevant documentation on the behalf of [name], MD,as directed by  [name], MD while in the presence of [name], MD.  This visit occurred during the SARS-CoV-2 public health emergency.  Safety protocols were in place, including screening questions prior to the visit, additional usage of staff PPE, and extensive cleaning of exam room while observing appropriate contact time as indicated for disinfecting solutions.  Subjective:     Patient ID: Tanya Levy , female    DOB: [birth year] , 71 y.o.   MRN: [id number]   Chief Complaint  Patient presents with   Diabetes    HPI  The patient is here today for a diabetes and bp check.  She reports compliance with meds. She denies headaches, chest pain and shortness of breath.    Diabetes She presents for her follow-up diabetic visit. She has type 2 diabetes mellitus. Her disease course has been improving. There are no hypoglycemic associated symptoms. Pertinent negatives for hypoglycemia include no headaches. Pertinent negatives for diabetes include no chest pain, no fatigue, no polydipsia, no polyphagia, no polyuria and no weakness. There are no hypoglycemic complications. Risk factors for coronary artery disease include diabetes mellitus, dyslipidemia, hypertension, obesity, sedentary lifestyle and post-menopausal. She is compliant with treatment most of the time. She is following a diabetic diet. She participates in exercise intermittently. Her breakfast blood glucose is taken between 8-9 am. Her breakfast blood glucose range is generally 110-130 mg/dl. An ACE inhibitor/angiotensin II receptor blocker is being taken. Eye exam is current.  Hypertension This is a chronic problem. The current episode started more than 1 year ago. The problem has been gradually improving since onset. The problem is controlled. Pertinent negatives include  no chest pain or headaches. Risk factors for coronary artery disease include diabetes mellitus, dyslipidemia, obesity, sedentary lifestyle and post-menopausal state. Compliance problems include exercise.     Past Medical History:  Diagnosis Date   Diabetes mellitus ([place])    Diabetes mellitus without complication ([place])    History of chicken pox    History of measles    History of mumps    Hypertension    Tilted uterus    UTI (urinary tract infection)      Family History  Problem Relation Age of Onset   Hypertension Mother    Hypertension Father      Current Outpatient Medications:    aspirin 81 MG EC tablet, Take 81 mg by mouth daily. Swallow whole., Disp: , Rfl:    b complex vitamins tablet, Take 1 tablet by mouth daily. 3 times per week, Disp: , Rfl:    CRESTOR 20 MG tablet, Take 1 tablet (20 mg total) by mouth daily., Disp: 30 tablet, Rfl: 11   diphenhydrAMINE (BENADRYL) 25 MG tablet, Take 1 tablet (25 mg total) by mouth every 6 (six) hours as needed for itching or allergies (Rash)., Disp: 30 tablet, Rfl: 0   dorzolamide-timolol (COSOPT) 22.3-6.8 MG/ML ophthalmic solution, SMARTSIG:In Eye(s), Disp: , Rfl:    EPINEPHrine 0.3 mg/0.3 mL IJ SOAJ injection, EpiPen 2-Pak 0.3 mg/0.3 mL injection, auto-injector, Disp: , Rfl:    latanoprost (XALATAN) 0.005 % ophthalmic solution, , Disp: , Rfl:    levothyroxine (SYNTHROID) 100 MCG tablet, TAKE 1 TABLET BY MOUTH EVERY DAY, Disp: 90 tablet, Rfl: 1   OZEMPIC, 1 MG/DOSE, 4 MG/3ML SOPN, INJECT $RemoveBefo'1MG'xKciOXiWncC$  INTO THE SKIN ONCE A WEEK, Disp: 9 mL, Rfl: 2  fluticasone (FLONASE) 50 MCG/ACT nasal spray, SPRAY 2 SPRAYS INTO EACH NOSTRIL EVERY DAY, Disp: 48 mL, Rfl: 2   valsartan-hydrochlorothiazide (DIOVAN-HCT) 160-12.5 MG tablet, TAKE 1 TABLET BY MOUTH EVERY DAY, Disp: 90 tablet, Rfl: 2   Allergies  Allergen Reactions   Shellfish Allergy Anaphylaxis   Shellfish Allergy      Review of Systems  Constitutional: Negative.  Negative for fatigue.   Respiratory: Negative.    Cardiovascular: Negative.  Negative for chest pain.  Endocrine: Negative for polydipsia, polyphagia and polyuria.  Neurological: Negative.  Negative for weakness and headaches.  Psychiatric/Behavioral: Negative.      Today's Vitals   11/06/21 0925  BP: 130/64  Pulse: 99  Temp: 98.2 F (36.8 C)  TempSrc: Oral  Weight: 179 lb 6.4 oz (81.4 kg)  Height: 4' 10.2" (1.478 m)   Body mass index is 37.24 kg/m.  Wt Readings from Last 3 Encounters:  11/06/21 179 lb 6.4 oz (81.4 kg)  07/31/21 182 lb (82.6 kg)  06/27/21 180 lb 3.2 oz (81.7 kg)    Objective:  Physical Exam Vitals and nursing note reviewed.  Constitutional:      Appearance: Normal appearance.  HENT:     Head: Normocephalic and atraumatic.     Nose:     Comments: Masked     Mouth/Throat:     Comments: Masked  Eyes:     Extraocular Movements: Extraocular movements intact.  Cardiovascular:     Rate and Rhythm: Normal rate and regular rhythm.     Heart sounds: Normal heart sounds.  Pulmonary:     Effort: Pulmonary effort is normal.     Breath sounds: Normal breath sounds.  Musculoskeletal:     Cervical back: Normal range of motion.  Skin:    General: Skin is warm.  Neurological:     General: No focal deficit present.     Mental Status: She is alert.  Psychiatric:        Mood and Affect: Mood normal.        Behavior: Behavior normal.     Assessment And Plan:     1. Uncontrolled type 2 diabetes mellitus with hyperglycemia (HCC) Comments: Chronic, I will check labs as below. I will adjust meds as needed .She will rto in 3 months for re-evaluation. - BMP8+EGFR - Hemoglobin A1c - AMB Referral to [place]  2. Essential hypertension, benign Comments: Chronic, well controlled. She is encouraged to follow low sodium diet. No med changes.  - AMB Referral to [place]  3. Primary hypothyroidism Comments: I will check thyroid panel and adjust meds as  needed.  - TSH - T4, Free  4. Seasonal allergies Comments: She was given refill of fluticasone NS, she will continue to use prn.   5. Class 2 severe obesity due to excess calories with serious comorbidity and body mass index (BMI) of 37.0 to 37.9 in adult [facility])  She is encouraged to strive for BMI less than 30 to decrease cardiac risk. Advised to aim for at least 150 minutes of exercise per week.  Patient was given opportunity to ask questions. Patient verbalized understanding of the plan and was able to repeat key elements of the plan. All questions were answered to their satisfaction.   I, [name], MD, have reviewed all documentation for this visit. The documentation on 11/06/21 for the exam, diagnosis, procedures, and orders are all accurate and complete.   IF YOU HAVE BEEN REFERRED TO A SPECIALIST, IT MAY  TAKE 1-2 WEEKS TO SCHEDULE/PROCESS THE REFERRAL. IF YOU HAVE NOT HEARD FROM US/SPECIALIST IN TWO WEEKS, PLEASE GIVE [place] A CALL AT [phone].   THE PATIENT IS ENCOURAGED TO PRACTICE SOCIAL DISTANCING DUE TO THE COVID-19 PANDEMIC.

## 2021-11-06 NOTE — Patient Instructions (Signed)

## 2021-11-07 ENCOUNTER — Telehealth: Payer: Self-pay | Admitting: *Deleted

## 2021-11-07 LAB — BMP8+EGFR
BUN/Creatinine Ratio: 21 (ref 12–28)
BUN: 19 mg/dL (ref 8–27)
CO2: 25 mmol/L (ref 20–29)
Calcium: 10.2 mg/dL (ref 8.7–10.3)
Chloride: 102 mmol/L (ref 96–106)
Creatinine, Ser: 0.9 mg/dL (ref 0.57–1.00)
Glucose: 111 mg/dL — ABNORMAL HIGH (ref 70–99)
Potassium: 4.8 mmol/L (ref 3.5–5.2)
Sodium: 142 mmol/L (ref 134–144)
eGFR: 69 mL/min/{1.73_m2} (ref >59)

## 2021-11-07 LAB — HEMOGLOBIN A1C
Est. average glucose Bld gHb Est-mCnc: 186 mg/dL
Hgb A1c MFr Bld: 8.1 % — ABNORMAL HIGH (ref 4.8–5.6)

## 2021-11-07 LAB — TSH: TSH: 1.56 u[IU]/mL (ref 0.450–4.500)

## 2021-11-07 LAB — T4, FREE: Free T4: 0.95 ng/dL (ref 0.82–1.77)

## 2021-11-07 NOTE — Chronic Care Management (AMB) (Signed)
°  Chronic Care Management   Outreach Note  11/07/2021 Name: Tanya Levy MRN: [id number] DOB: 1950/11/14  Tanya Levy is a 71 y.o. year old female who is a primary care patient of Dorothyann Peng, MD. I reached out to Tanya Levy by phone today in response to a referral sent by Tanya Levy's primary care provider.  An unsuccessful telephone outreach was attempted today. The patient was referred to the case management team for assistance with care management and care coordination.   Follow Up Plan: A HIPAA compliant phone message was left for the patient providing contact information and requesting a return call.  The care management team will reach out to the patient again over the next 7 days.  If patient returns call to provider office, please advise to call Embedded Care Management Care Guide Tanya Levy* at [phone]  Care Guide, Embedded Care Coordination [facility]   Care Management  Direct Dial: [phone]

## 2021-11-07 NOTE — Chronic Care Management (AMB) (Signed)
Chronic Care Management   Note  11/07/2021 Name: Tanya Levy MRN: [id number] DOB: 1951/07/06  Tanya Levy is a 71 y.o. year old female who is a primary care patient of Glendale Chard, MD. I reached out to Alyza D Levy by phone today in response to a referral sent by Tanya Levy's PCP.  Tanya Levy was given information about Chronic Care Management services today including:  CCM service includes personalized support from designated clinical staff supervised by her physician, including individualized plan of care and coordination with other care providers 24/7 contact phone numbers for assistance for urgent and routine care needs. Service will only be billed when office clinical staff spend 20 minutes or more in a month to coordinate care. Only one practitioner may furnish and bill the service in a calendar month. The patient may stop CCM services at any time (effective at the end of the month) by phone call to the office staff. The patient is responsible for co-pay (up to 20% after annual deductible is met) if co-pay is required by the individual health plan.   Patient agreed to services and verbal consent obtained.   Follow up plan: Telephone appointment with care management team member scheduled for:11/19/21  [place] [facility]  Direct Dial: [phone]

## 2021-11-19 ENCOUNTER — Telehealth: Payer: Self-pay

## 2021-11-19 ENCOUNTER — Telehealth: Payer: Medicare Other

## 2021-11-19 ENCOUNTER — Ambulatory Visit (INDEPENDENT_AMBULATORY_CARE_PROVIDER_SITE_OTHER): Payer: Medicare Other

## 2021-11-19 DIAGNOSIS — E039 Hypothyroidism, unspecified: Secondary | ICD-10-CM

## 2021-11-19 DIAGNOSIS — E1165 Type 2 diabetes mellitus with hyperglycemia: Secondary | ICD-10-CM

## 2021-11-19 DIAGNOSIS — I10 Essential (primary) hypertension: Secondary | ICD-10-CM

## 2021-11-19 NOTE — Telephone Encounter (Signed)
?  Care Management  ? ?Follow Up Note ? ? ?11/19/2021 ?Name: Tanya Levy MRN: [id number] DOB: [birth year] ? ? ?Referred by: Dorothyann Peng, MD ?Reason for referral : Chronic Care Management (Initial RN CM Outreach ) ? ? ?An unsuccessful telephone outreach was attempted today. The patient was referred to the case management team for assistance with care management and care coordination.  ? ?Follow Up Plan: A HIPPA compliant phone message was left for the patient providing contact information and requesting a return call.  ? ?Delsa Sale, RN, BSN, CCM ?Care Management Coordinator ?[facility] Care Management/[facility]  ?Direct Phone: [phone]' ? ?

## 2021-11-20 NOTE — Chronic Care Management (AMB) (Signed)
Chronic Care Management   CCM RN Visit Note  11/19/2021 Name: Tanya Levy MRN: [id number] DOB: Dec 18, 1950  Subjective: Tanya Levy is a 71 y.o. year old female who is a primary care patient of Glendale Chard, MD. The care management team was consulted for assistance with disease management and care coordination needs.    Engaged with patient by telephone for initial visit in response to provider referral for case management and/or care coordination services.   Consent to Services:  The patient was given information about Chronic Care Management services, agreed to services, and gave verbal consent prior to initiation of services.  Please see initial visit note for detailed documentation.   Patient agreed to services and verbal consent obtained.   Assessment: Review of patient past medical history, allergies, medications, health status, including review of consultants reports, laboratory and other test data, was performed as part of comprehensive evaluation and provision of chronic care management services.   SDOH (Social Determinants of Health) assessments and interventions performed:    CCM Care Plan  Allergies  Allergen Reactions   Shellfish Allergy Anaphylaxis   Shellfish Allergy     Outpatient Encounter Medications as of 11/19/2021  Medication Sig   aspirin 81 MG EC tablet Take 81 mg by mouth daily. Swallow whole.   b complex vitamins tablet Take 1 tablet by mouth daily. 3 times per week   CRESTOR 20 MG tablet Take 1 tablet (20 mg total) by mouth daily.   diphenhydrAMINE (BENADRYL) 25 MG tablet Take 1 tablet (25 mg total) by mouth every 6 (six) hours as needed for itching or allergies (Rash).   dorzolamide-timolol (COSOPT) 22.3-6.8 MG/ML ophthalmic solution SMARTSIG:In Eye(s)   EPINEPHrine 0.3 mg/0.3 mL IJ SOAJ injection EpiPen 2-Pak 0.3 mg/0.3 mL injection, auto-injector   fluticasone (FLONASE) 50 MCG/ACT nasal spray SPRAY 2 SPRAYS INTO EACH NOSTRIL EVERY  DAY   latanoprost (XALATAN) 0.005 % ophthalmic solution    levothyroxine (SYNTHROID) 100 MCG tablet TAKE 1 TABLET BY MOUTH EVERY DAY   OZEMPIC, 1 MG/DOSE, 4 MG/3ML SOPN INJECT 1MG  INTO THE SKIN ONCE A WEEK   valsartan-hydrochlorothiazide (DIOVAN-HCT) 160-12.5 MG tablet TAKE 1 TABLET BY MOUTH EVERY DAY   No facility-administered encounter medications on file as of 11/19/2021.    Patient Active Problem List   Diagnosis Date Noted   Uncontrolled type 2 diabetes mellitus with hyperglycemia ([place]) 06/27/2021   Immunization due 02/27/2021   Pure hypercholesterolemia 08/24/2018   Chronic pain of left knee 08/24/2018   Class 1 obesity due to excess calories with serious comorbidity and body mass index (BMI) of 34.0 to 34.9 in adult 08/24/2018   Essential hypertension, benign    Diabetes mellitus ([place])    Tilted uterus     Conditions to be addressed/monitored: Uncontrolled type 2 diabetes mellitus with hyperglycemia, Essential hypertension, Primary hypothyroidism  Care Plan : RN CM Plan of Care  Updates made by Lynne Logan, RN since 11/19/2021 12:00 AM     Problem: CHL AMB "PATIENT-SPECIFIC PROBLEM" Resolved 11/20/2021     Goal: Patient-Specific Goal Completed 11/20/2021     Problem: No plan of care established fo rmanagement of chronic disease states (Uncontrolled type 2 diabetes mellitus with hyperglycemia, Essential hypertension, Primary hypothyroidism)   Priority: High     Long-Range Goal: Establishment of plan of care for management of chronic disease states (Uncontrolled type 2 diabetes mellitus with hyperglycemia, Essential hypertension, Primary hypothyroidism)   Start Date: 11/19/2021  Expected End Date: 11/20/2022  This Visit's Progress:  On track  Priority: High  Note:   Current Barriers:  Knowledge Deficits related to plan of care for management of Uncontrolled type 2 diabetes mellitus with hyperglycemia, Essential hypertension, Primary hypothyroidism  Chronic Disease Management  support and education needs related to Uncontrolled type 2 diabetes mellitus with hyperglycemia, Essential hypertension, Primary hypothyroidism   RNCM Clinical Goal(s):  Patient will verbalize basic understanding of  Uncontrolled type 2 diabetes mellitus with hyperglycemia, Essential hypertension, Primary hypothyroidism disease process and self health management plan as evidenced by patient will experience having no disease exacerbations related to her chronic disease states as listed above take all medications exactly as prescribed and will call provider for medication related questions as evidenced by patient will demonstrate improved understanding of prescribed medications and rationale for usage as evidenced by patient teach back demonstrate Improved health management independence as evidenced by patient will report 100% adherence to her prescribed treatment plan  continue to work with RN Care Manager to address care management and care coordination needs related to  Uncontrolled type 2 diabetes mellitus with hyperglycemia, Essential hypertension, Primary hypothyroidism as evidenced by adherence to CM Team Scheduled appointments demonstrate ongoing self health care management ability   as evidenced by    through collaboration with RN Care manager, provider, and care team.   Interventions: 1:1 collaboration with primary care provider regarding development and update of comprehensive plan of care as evidenced by provider attestation and co-signature Inter-disciplinary care team collaboration (see longitudinal plan of care) Evaluation of current treatment plan related to  self management and patient's adherence to plan as established by provider   Diabetes Interventions:  (Status:  New goal.) Long Term Goal Assessed patient's understanding of A1c goal: <7% Provided education to patient about basic DM disease process Reviewed medications with patient and discussed importance of medication  adherence Reviewed and discussed PCP recommendations; "add Farxiga 10mg  once daily. This will cause urinary frequency initially and you may have yeast infection/UTI the first two weeks. It is important to stay well hydrated while on this medication. You can come get samples next week and I need to see you in four weeks. Please cut out sodas, juices and increase daily activity." Patient verbalizes understanding, she would like to pick up [place] samples next week upon returning home from being out of town Sent secure message to Dr. Baird Cancer to advise patient can pick up [place] samples next week once she returns home from traveling  [place] on importance of regular laboratory monitoring as prescribed Provided patient with written educational materials related to hypo and hyperglycemia and importance of correct treatment Advised patient, providing education and rationale, to check cbg daily before meals and at bedtime and record, calling PCP for findings outside established parameters Review of patient status, including review of consultants reports, relevant laboratory and other test results, and medications completed Assessed social determinant of health barriers Educated patient on dietary and exercise recommendations; daily glycemic control FBS 80-130, <180 after meals;15'15' rule Mailed printed educational materials related to Diabetes Management  Discussed plans with patient for ongoing care management follow up and provided patient with direct contact information for care management team Lab Results  Component Value Date   HGBA1C 8.1 (H) 11/06/2021   Patient Goals/Self-Care Activities: Take all medications as prescribed Attend all scheduled provider appointments Call pharmacy for medication refills 3-7 days in advance of running out of medications Perform all self care activities independently  Perform IADL's (shopping, preparing meals, housekeeping, managing finances) independently Call  provider office  for new concerns or questions  schedule appointment with eye doctor check blood sugar at prescribed times: before meals and at bedtime check feet daily for cuts, sores or redness drink 6 to 8 glasses of water each day fill half of plate with vegetables  Follow Up Plan:  Telephone follow up appointment with care management team member scheduled for:  12/20/21       Barb Merino, RN, BSN, CCM Care Management Coordinator [place] [facility]  Direct Phone: [phone]

## 2021-11-20 NOTE — Patient Instructions (Addendum)
Visit Information  ? ?Thank you for taking time to visit with me today. Please don't hesitate to contact me if I can be of assistance to you before our next scheduled telephone appointment. ? ?Following are the goals we discussed today:  ?(Copy and paste patient goals from clinical care plan here) ? ?Our next appointment is by telephone on 12/20/21 at 2:15 PM ? ?Please call the care guide team at [phone] if you need to cancel or reschedule your appointment.  ? ?If you are experiencing a Mental Health or [place] or need someone to talk to, please call [phone]-TALK (toll free, 24 hour hotline)  ? ?Following is a copy of your full care plan:  ?Care Plan : RN CM Plan of Care  ?Updates made by Lynne Logan, RN since 11/19/2021 12:00 AM  ?  ? ?Problem: [facility] AMB "PATIENT-SPECIFIC PROBLEM" Resolved 11/20/2021  ?  ? ?Goal: Patient-Specific Goal Completed 11/20/2021  ?  ? ?Problem: No plan of care established fo rmanagement of chronic disease states (Uncontrolled type 2 diabetes mellitus with hyperglycemia, Essential hypertension, Primary hypothyroidism)   ?Priority: High  ?  ? ?Long-Range Goal: Establishment of plan of care for management of chronic disease states (Uncontrolled type 2 diabetes mellitus with hyperglycemia, Essential hypertension, Primary hypothyroidism)   ?Start Date: 11/19/2021  ?Expected End Date: 11/20/2022  ?This Visit's Progress: On track  ?Priority: High  ?Note:   ?Current Barriers:  ?Knowledge Deficits related to plan of care for management of Uncontrolled type 2 diabetes mellitus with hyperglycemia, Essential hypertension, Primary hypothyroidism  ?Chronic Disease Management support and education needs related to Uncontrolled type 2 diabetes mellitus with hyperglycemia, Essential hypertension, Primary hypothyroidism  ? ?RNCM Clinical Goal(s):  ?Patient will verbalize basic understanding of  Uncontrolled type 2 diabetes mellitus with hyperglycemia, Essential hypertension, Primary  hypothyroidism disease process and self health management plan as evidenced by patient will experience having no disease exacerbations related to her chronic disease states as listed above ?take all medications exactly as prescribed and will call provider for medication related questions as evidenced by patient will demonstrate improved understanding of prescribed medications and rationale for usage as evidenced by patient teach back ?demonstrate Improved health management independence as evidenced by patient will report 100% adherence to her prescribed treatment plan  ?continue to work with RN Care Manager to address care management and care coordination needs related to  Uncontrolled type 2 diabetes mellitus with hyperglycemia, Essential hypertension, Primary hypothyroidism as evidenced by adherence to CM Team Scheduled appointments ?demonstrate ongoing self health care management ability   as evidenced by    through collaboration with RN Care manager, provider, and care team.  ? ?Interventions: ?1:1 collaboration with primary care provider regarding development and update of comprehensive plan of care as evidenced by provider attestation and co-signature ?Inter-disciplinary care team collaboration (see longitudinal plan of care) ?Evaluation of current treatment plan related to  self management and patient's adherence to plan as established by provider ? ? ?Diabetes Interventions:  (Status:  New goal.) Long Term Goal ?Assessed patient's understanding of A1c goal: <7% ?Provided education to patient about basic DM disease process ?Reviewed medications with patient and discussed importance of medication adherence ?Reviewed and discussed PCP recommendations; "add Farxiga 54m once daily. This will cause urinary frequency initially and you may have yeast infection/UTI the first two weeks. It is important to stay well hydrated while on this medication. You can come get samples next week and I need to see you in four  weeks. Please cut out sodas, juices and increase daily activity." Patient verbalizes understanding, she would like to pick up [place] samples next week upon returning home from being out of town ?Sent secure message to Dr. Baird Cancer to advise patient can pick up [place] samples next week once she returns home from traveling  ?Counseled on importance of regular laboratory monitoring as prescribed ?Provided patient with written educational materials related to hypo and hyperglycemia and importance of correct treatment ?Advised patient, providing education and rationale, to check cbg daily before meals and at bedtime and record, calling PCP for findings outside established parameters ?Review of patient status, including review of consultants reports, relevant laboratory and other test results, and medications completed ?Assessed social determinant of health barriers ?Educated patient on dietary and exercise recommendations; daily glycemic control FBS 80-130, <180 after meals;15'15' rule ?Mailed printed educational materials related to Diabetes Management  ?Discussed plans with patient for ongoing care management follow up and provided patient with direct contact information for care management team ?Lab Results  ?Component Value Date  ? HGBA1C 8.1 (H) 11/06/2021  ? ?Patient Goals/Self-Care Activities: ?Take all medications as prescribed ?Attend all scheduled provider appointments ?Call pharmacy for medication refills 3-7 days in advance of running out of medications ?Perform all self care activities independently  ?Perform IADL's (shopping, preparing meals, housekeeping, managing finances) independently ?Call provider office for new concerns or questions  ?schedule appointment with eye doctor ?check blood sugar at prescribed times: before meals and at bedtime ?check feet daily for cuts, sores or redness ?drink 6 to 8 glasses of water each day ?fill half of plate with vegetables ? ?Follow Up Plan:  Telephone follow up  appointment with care management team member scheduled for:  12/20/21   ?  ? ? ?Consent to CCM Services: ?Ms. Johnson-Squire was given information about Chronic Care Management services including:  ?CCM service includes personalized support from designated clinical staff supervised by her physician, including individualized plan of care and coordination with other care providers ?24/7 contact phone numbers for assistance for urgent and routine care needs. ?Service will only be billed when office clinical staff spend 20 minutes or more in a month to coordinate care. ?Only one practitioner may furnish and bill the service in a calendar month. ?The patient may stop CCM services at any time (effective at the end of the month) by phone call to the office staff. ?The patient will be responsible for cost sharing (co-pay) of up to 20% of the service fee (after annual deductible is met). ? ?Patient agreed to services and verbal consent obtained.  ? ?The patient verbalized understanding of instructions, educational materials, and care plan provided today and agreed to receive a mailed copy of patient instructions, educational materials, and care plan.  ? ?Telephone follow up appointment with care management team member scheduled for: 12/20/21 ? ? ?  ?

## 2021-11-26 ENCOUNTER — Telehealth: Payer: Medicare Other

## 2021-12-13 DIAGNOSIS — E039 Hypothyroidism, unspecified: Secondary | ICD-10-CM

## 2021-12-13 DIAGNOSIS — I10 Essential (primary) hypertension: Secondary | ICD-10-CM

## 2021-12-13 DIAGNOSIS — E1165 Type 2 diabetes mellitus with hyperglycemia: Secondary | ICD-10-CM

## 2021-12-20 ENCOUNTER — Telehealth: Payer: Medicare Other

## 2022-01-07 ENCOUNTER — Encounter: Payer: Self-pay | Admitting: Internal Medicine

## 2022-01-07 ENCOUNTER — Ambulatory Visit (INDEPENDENT_AMBULATORY_CARE_PROVIDER_SITE_OTHER): Payer: Medicare Other | Admitting: Internal Medicine

## 2022-01-07 VITALS — BP 132/78 | HR 94 | Temp 98.4°F | Ht 58.2 in | Wt 174.8 lb

## 2022-01-07 DIAGNOSIS — Z6836 Body mass index (BMI) 36.0-36.9, adult: Secondary | ICD-10-CM | POA: Diagnosis not present

## 2022-01-07 DIAGNOSIS — E1165 Type 2 diabetes mellitus with hyperglycemia: Secondary | ICD-10-CM

## 2022-01-07 DIAGNOSIS — M7989 Other specified soft tissue disorders: Secondary | ICD-10-CM | POA: Diagnosis not present

## 2022-01-07 NOTE — Progress Notes (Signed)
?Jeri Cos Llittleton,acting as a Neurosurgeon for [name], MD.,have documented all relevant documentation on the behalf of [name], MD,as directed by  [name], MD while in the presence of [name], MD.  ?This visit occurred during the SARS-CoV-2 public health emergency.  Safety protocols were in place, including screening questions prior to the visit, additional usage of staff PPE, and extensive cleaning of exam room while observing appropriate contact time as indicated for disinfecting solutions. ? ?Subjective:  ?  ? Patient ID: Tanya Levy , female    DOB: [birth year] , 71 y.o.   MRN: [id number] ? ? ?Chief Complaint  ?Patient presents with  ? Leg Swelling  ? ? ?HPI ? ?Patient presents today for leg swelling. She noticed that she had some LE swelling last week when visiting her brother in the hospital. He is being treated for CHF, so she is concerned this also may be an issue for her. She went home - elevated her legs, increased her water intake, and the sx resolved.  She adds that she had leg swelling yesterday while at work, so she went home. She works at [employer] and does a lot of walking. She also recalls eating pretzels yesterday. She also had a sausage biscuit for breakfast.  She is unable to identify any specific triggers for her sx.  ?  ? ?Past Medical History:  ?Diagnosis Date  ? Diabetes mellitus (HCC)   ? Diabetes mellitus without complication (HCC)   ? History of chicken pox   ? History of measles   ? History of mumps   ? Hypertension   ? Tilted uterus   ? UTI (urinary tract infection)   ?  ? ?Family History  ?Problem Relation Age of Onset  ? Hypertension Mother   ? Hypertension Father   ? ? ? ?Current Outpatient Medications:  ?  aspirin 81 MG EC tablet, Take 81 mg by mouth daily. Swallow whole., Disp: , Rfl:  ?  b complex vitamins tablet, Take 1 tablet by mouth daily. 3 times per week, Disp: , Rfl:  ?  CRESTOR 20 MG tablet, Take 1 tablet (20 mg total) by mouth  daily., Disp: 30 tablet, Rfl: 11 ?  diphenhydrAMINE (BENADRYL) 25 MG tablet, Take 1 tablet (25 mg total) by mouth every 6 (six) hours as needed for itching or allergies (Rash)., Disp: 30 tablet, Rfl: 0 ?  dorzolamide-timolol (COSOPT) 22.3-6.8 MG/ML ophthalmic solution, SMARTSIG:In Eye(s), Disp: , Rfl:  ?  EPINEPHrine 0.3 mg/0.3 mL IJ SOAJ injection, EpiPen 2-Pak 0.3 mg/0.3 mL injection, auto-injector, Disp: , Rfl:  ?  fluticasone (FLONASE) 50 MCG/ACT nasal spray, SPRAY 2 SPRAYS INTO EACH NOSTRIL EVERY DAY, Disp: 48 mL, Rfl: 2 ?  latanoprost (XALATAN) 0.005 % ophthalmic solution, , Disp: , Rfl:  ?  levothyroxine (SYNTHROID) 100 MCG tablet, TAKE 1 TABLET BY MOUTH EVERY DAY, Disp: 90 tablet, Rfl: 1 ?  OZEMPIC, 1 MG/DOSE, 4 MG/3ML SOPN, INJECT 1MG  INTO THE SKIN ONCE A WEEK, Disp: 9 mL, Rfl: 2 ?  valsartan-hydrochlorothiazide (DIOVAN-HCT) 160-12.5 MG tablet, TAKE 1 TABLET BY MOUTH EVERY DAY, Disp: 90 tablet, Rfl: 2  ? ?Allergies  ?Allergen Reactions  ? Shellfish Allergy Anaphylaxis  ? Shellfish Allergy   ?  ? ?Review of Systems  ?Constitutional: Negative.   ?Respiratory: Negative.  Negative for shortness of breath.   ?Cardiovascular:  Positive for leg swelling. Negative for chest pain.  ?Gastrointestinal: Negative.   ?Neurological: Negative.   ?Psychiatric/Behavioral: Negative.     ? ?  Today's Vitals  ? 01/07/22 1533  ?BP: 132/78  ?Pulse: 94  ?Temp: 98.4 ?F (36.9 ?C)  ?Weight: 174 lb 12.8 oz (79.3 kg)  ?Height: 4' 10.2" (1.478 m)  ?PainSc: 0-No pain  ? ?Body mass index is 36.28 kg/m?.  ?Wt Readings from Last 3 Encounters:  ?01/07/22 174 lb 12.8 oz (79.3 kg)  ?11/06/21 179 lb 6.4 oz (81.4 kg)  ?07/31/21 182 lb (82.6 kg)  ?  ? ?Objective:  ?Physical Exam ?Vitals and nursing note reviewed.  ?Constitutional:   ?   Appearance: Normal appearance.  ?HENT:  ?   Head: Normocephalic and atraumatic.  ?Eyes:  ?   Extraocular Movements: Extraocular movements intact.  ?Cardiovascular:  ?   Rate and Rhythm: Normal rate and regular  rhythm.  ?   Heart sounds: Normal heart sounds.  ?Pulmonary:  ?   Effort: Pulmonary effort is normal.  ?   Breath sounds: Normal breath sounds.  ?Musculoskeletal:  ?   Cervical back: Normal range of motion.  ?   Right lower leg: Edema present.  ?   Left lower leg: Edema present.  ?   Comments: Trace edema b/l  ?Skin: ?   General: Skin is warm.  ?Neurological:  ?   General: No focal deficit present.  ?   Mental Status: She is alert.  ?Psychiatric:     ?   Mood and Affect: Mood normal.     ?   Behavior: Behavior normal.  ?   ?Assessment And Plan:  ?   ?1. Leg swelling ?Comments: Trace edema is present today. There is no pitting. She is encouraged to limit her salt intake, stay well hydrated and elevate legs when needed. She is w/o SOB and cp. She will let me know if she develops these sx. She may also benefit from wearing compression hose while at work.  ? ?2. Uncontrolled type 2 diabetes mellitus with hyperglycemia (HCC) ?Comments: I will check for microalbuminuria today.  ?- Urine Albumin-Creatinine with uACR ? ?3. Class 2 severe obesity due to excess calories with serious comorbidity and body mass index (BMI) of 36.0 to 36.9 in adult [facility]) ?Comments: She is enocuraged to strive for BMI<30 to decrease cardiac risk. Advised to aim for at least 150 minutes of exercise per week.  ?  ?Patient was given opportunity to ask questions. Patient verbalized understanding of the plan and was able to repeat key elements of the plan. All questions were answered to their satisfaction.  ? ?I, [name], MD, have reviewed all documentation for this visit. The documentation on 01/07/22 for the exam, diagnosis, procedures, and orders are all accurate and complete.  ? ?IF YOU HAVE BEEN REFERRED TO A SPECIALIST, IT MAY TAKE 1-2 WEEKS TO SCHEDULE/PROCESS THE REFERRAL. IF YOU HAVE NOT HEARD FROM US/SPECIALIST IN TWO WEEKS, PLEASE GIVE [place] A CALL AT [phone].  ? ?THE PATIENT IS ENCOURAGED TO PRACTICE SOCIAL DISTANCING DUE TO  THE COVID-19 PANDEMIC.   ?

## 2022-01-07 NOTE — Patient Instructions (Signed)
Peripheral Edema  Peripheral edema is swelling that is caused by a buildup of fluid. Peripheral edema most often affects the lower legs, ankles, and feet. It can also develop in the arms, hands, and face. The area of the body that has peripheral edema will look swollen. It may also feel heavy or warm. Your clothes may start to feel tight. Pressing on the area may make a temporary dent in your skin (pitting edema). You may not be able to move your swollen arm or leg as much as usual. There are many causes of peripheral edema. It can happen because of a complication of other conditions such as heart failure, kidney disease, or a problem with your circulation. It also can be a side effect of certain medicines or happen because of an infection. It often happens to women during pregnancy. Sometimes, the cause is not known. Follow these instructions at home: Managing pain, stiffness, and swelling  Raise (elevate) your legs while you are sitting or lying down. Move around often to prevent stiffness and to reduce swelling. Do not sit or stand for long periods of time. Do not wear tight clothing. Do not wear garters on your upper legs. Exercise your legs to get your circulation going. This helps to move the fluid back into your blood vessels, and it may help the swelling go down. Wear compression stockings as told by your health care provider. These stockings help to prevent blood clots and reduce swelling in your legs. It is important that these are the correct size. These stockings should be prescribed by your doctor to prevent possible injuries. If elastic bandages or wraps are recommended, use them as told by your health care provider. Medicines Take over-the-counter and prescription medicines only as told by your health care provider. Your health care provider may prescribe medicine to help your body get rid of excess water (diuretic). Take this medicine if you are told to take it. General  instructions Eat a low-salt (low-sodium) diet as told by your health care provider. Sometimes, eating less salt may reduce swelling. Pay attention to any changes in your symptoms. Moisturize your skin daily to help prevent skin from cracking and draining. Keep all follow-up visits. This is important. Contact a health care provider if: You have a fever. You have swelling in only one leg. You have increased swelling, redness, or pain in one or both of your legs. You have drainage or sores at the area where you have edema. Get help right away if: You have edema that starts suddenly or is getting worse, especially if you are pregnant or have a medical condition. You develop shortness of breath, especially when you are lying down. You have pain in your chest or abdomen. You feel weak. You feel like you will faint. These symptoms may be an emergency. Get help right away. Call 911. Do not wait to see if the symptoms will go away. Do not drive yourself to the hospital. Summary Peripheral edema is swelling that is caused by a buildup of fluid. Peripheral edema most often affects the lower legs, ankles, and feet. Move around often to prevent stiffness and to reduce swelling. Do not sit or stand for long periods of time. Pay attention to any changes in your symptoms. Contact a health care provider if you have edema that starts suddenly or is getting worse, especially if you are pregnant or have a medical condition. Get help right away if you develop shortness of breath, especially when lying down.   This information is not intended to replace advice given to you by your health care provider. Make sure you discuss any questions you have with your health care provider. Document Revised: 05/06/2021 Document Reviewed: 05/06/2021 Elsevier Patient Education  2023 [employer]  

## 2022-02-06 ENCOUNTER — Encounter: Payer: Self-pay | Admitting: Internal Medicine

## 2022-02-06 ENCOUNTER — Ambulatory Visit (INDEPENDENT_AMBULATORY_CARE_PROVIDER_SITE_OTHER): Payer: Medicare Other | Admitting: Internal Medicine

## 2022-02-06 VITALS — BP 132/76 | HR 83 | Temp 98.3°F | Ht <= 58 in | Wt 174.0 lb

## 2022-02-06 DIAGNOSIS — E1165 Type 2 diabetes mellitus with hyperglycemia: Secondary | ICD-10-CM

## 2022-02-06 DIAGNOSIS — M79674 Pain in right toe(s): Secondary | ICD-10-CM | POA: Diagnosis not present

## 2022-02-06 DIAGNOSIS — Z6836 Body mass index (BMI) 36.0-36.9, adult: Secondary | ICD-10-CM

## 2022-02-06 DIAGNOSIS — I10 Essential (primary) hypertension: Secondary | ICD-10-CM | POA: Diagnosis not present

## 2022-02-06 NOTE — Patient Instructions (Signed)

## 2022-02-06 NOTE — Progress Notes (Signed)
I,Tanya Levy,acting as a Education administrator for Tanya Greenland, MD.,have documented all relevant documentation on the behalf of Tanya Greenland, MD,as directed by  Tanya Greenland, MD while in the presence of Tanya Greenland, MD.  This visit occurred during the SARS-CoV-2 public health emergency.  Safety protocols were in place, including screening questions prior to the visit, additional usage of staff PPE, and extensive cleaning of exam room while observing appropriate contact time as indicated for disinfecting solutions.  Subjective:     Patient ID: Tanya Levy , female    DOB: 17-Aug-1951 , 71 y.o.   MRN: [id number]   Chief Complaint  Patient presents with   Diabetes    HPI  The patient is here today for a diabetes and bp check.  She was started on Farxiga 3m at her last visit. She has tolerated the medication without any problems. She reports compliance with meds. She denies headaches, chest pain and shortness of breath.    Diabetes She presents for her follow-up diabetic visit. She has type 2 diabetes mellitus. Her disease course has been improving. There are no hypoglycemic associated symptoms. Pertinent negatives for hypoglycemia include no headaches. Pertinent negatives for diabetes include no chest pain, no fatigue, no polydipsia, no polyphagia, no polyuria and no weakness. There are no hypoglycemic complications. Risk factors for coronary artery disease include diabetes mellitus, dyslipidemia, hypertension, obesity, sedentary lifestyle and post-menopausal. She is compliant with treatment most of the time. She is following a diabetic diet. She participates in exercise intermittently. Her breakfast blood glucose is taken between 8-9 am. Her breakfast blood glucose range is generally 110-130 mg/dl. An ACE inhibitor/angiotensin II receptor blocker is being taken. Eye exam is current.  Hypertension This is a chronic problem. The current episode started more than 1 year ago. The  problem has been gradually improving since onset. The problem is controlled. Pertinent negatives include no chest pain or headaches. Risk factors for coronary artery disease include diabetes mellitus, dyslipidemia, obesity, sedentary lifestyle and post-menopausal state. Compliance problems include exercise.     Past Medical History:  Diagnosis Date   Diabetes mellitus ([place]    Diabetes mellitus without complication ([place]    History of chicken pox    History of measles    History of mumps    Hypertension    Tilted uterus    UTI (urinary tract infection)      Family History  Problem Relation Age of Onset   Hypertension Mother    Hypertension Father      Current Outpatient Medications:    aspirin 81 MG EC tablet, Take 81 mg by mouth daily. Swallow whole., Disp: , Rfl:    b complex vitamins tablet, Take 1 tablet by mouth daily. 3 times per week, Disp: , Rfl:    CRESTOR 20 MG tablet, Take 1 tablet (20 mg total) by mouth daily., Disp: 30 tablet, Rfl: 11   dapagliflozin propanediol (FARXIGA) 10 MG TABS tablet, Take 1 tablet (10 mg total) by mouth daily before breakfast., Disp: 90 tablet, Rfl: 2   diphenhydrAMINE (BENADRYL) 25 MG tablet, Take 1 tablet (25 mg total) by mouth every 6 (six) hours as needed for itching or allergies (Rash)., Disp: 30 tablet, Rfl: 0   dorzolamide-timolol (COSOPT) 22.3-6.8 MG/ML ophthalmic solution, SMARTSIG:In Eye(s), Disp: , Rfl:    EPINEPHrine 0.3 mg/0.3 mL IJ SOAJ injection, EpiPen 2-Pak 0.3 mg/0.3 mL injection, auto-injector, Disp: , Rfl:    fluticasone (FLONASE) 50 MCG/ACT nasal spray, SPRAY 2 SPRAYS  INTO EACH NOSTRIL EVERY DAY, Disp: 48 mL, Rfl: 2   latanoprost (XALATAN) 0.005 % ophthalmic solution, , Disp: , Rfl:    levothyroxine (SYNTHROID) 100 MCG tablet, TAKE 1 TABLET BY MOUTH EVERY DAY, Disp: 90 tablet, Rfl: 1   OZEMPIC, 1 MG/DOSE, 4 MG/3ML SOPN, INJECT 1MG INTO THE SKIN ONCE A WEEK, Disp: 9 mL, Rfl: 2   valsartan-hydrochlorothiazide (DIOVAN-HCT)  160-12.5 MG tablet, TAKE 1 TABLET BY MOUTH EVERY DAY, Disp: 90 tablet, Rfl: 2   Allergies  Allergen Reactions   Shellfish Allergy Anaphylaxis   Shellfish Allergy      Review of Systems  Constitutional: Negative.  Negative for fatigue.  Respiratory: Negative.    Cardiovascular: Negative.  Negative for chest pain.  Gastrointestinal: Negative.   Endocrine: Negative for polydipsia, polyphagia and polyuria.  Musculoskeletal:        She c/o right toe pain. States she had to go to urgent care about a month ago. She was diagnosed with gout. She was not given rx meds to take daily. She is not sure what may have triggered her sx. She is allergic to shellfish.   Neurological: Negative.  Negative for weakness and headaches.    Today's Vitals   02/06/22 0831  BP: 132/76  Pulse: 83  Temp: 98.3 F (36.8 C)  TempSrc: Oral  Weight: 174 lb (78.9 kg)  Height: 4' 10"  (1.473 m)   Body mass index is 36.37 kg/m.  Wt Readings from Last 3 Encounters:  02/06/22 174 lb (78.9 kg)  01/07/22 174 lb 12.8 oz (79.3 kg)  11/06/21 179 lb 6.4 oz (81.4 kg)    Objective:  Physical Exam Vitals and nursing note reviewed.  Constitutional:      Appearance: Normal appearance.  HENT:     Head: Normocephalic and atraumatic.  Eyes:     Extraocular Movements: Extraocular movements intact.  Cardiovascular:     Rate and Rhythm: Normal rate and regular rhythm.     Heart sounds: Normal heart sounds.  Pulmonary:     Effort: Pulmonary effort is normal.     Breath sounds: Normal breath sounds.  Musculoskeletal:     Cervical back: Normal range of motion.  Skin:    General: Skin is warm.  Neurological:     General: No focal deficit present.     Mental Status: She is alert.  Psychiatric:        Mood and Affect: Mood normal.        Behavior: Behavior normal.      Assessment And Plan:     1. Uncontrolled type 2 diabetes mellitus with hyperglycemia (HCC) Comments: Chronic, I will check kidney function today  along with a1c.  She will c/w Farxiga 64m daily and Ozempic 142mweekly. She will f/u in 3-4 months.  - Hemoglobin A1c - CMP14+EGFR  2. Essential hypertension, benign Comments: Chronic, fair control. Goal BP<130/80. She is encouraged to follow low sodium diet.   3. Great toe pain, right Comments: Urgent care notes reviewed. Suggestive of gout. I will check uric acid level.  I will request Urgent Care records as well. - Uric acid  4. Class 2 severe obesity due to excess calories with serious comorbidity and body mass index (BMI) of 36.0 to 36.9 in adult ([facility] She is encouraged to strive for BMI less than 30 to decrease cardiac risk. Advised to aim for at least 150 minutes of exercise per week.  Patient was given opportunity to ask questions. Patient verbalized understanding of the  plan and was able to repeat key elements of the plan. All questions were answered to their satisfaction.   I, Tanya Greenland, MD, have reviewed all documentation for this visit. The documentation on 02/06/22 for the exam, diagnosis, procedures, and orders are all accurate and complete.   IF YOU HAVE BEEN REFERRED TO A SPECIALIST, IT MAY TAKE 1-2 WEEKS TO SCHEDULE/PROCESS THE REFERRAL. IF YOU HAVE NOT HEARD FROM US/SPECIALIST IN TWO WEEKS, PLEASE GIVE [place] A CALL AT [phone] X [phone].   THE PATIENT IS ENCOURAGED TO PRACTICE SOCIAL DISTANCING DUE TO THE COVID-19 PANDEMIC.

## 2022-02-07 ENCOUNTER — Other Ambulatory Visit: Payer: Self-pay

## 2022-02-07 LAB — HEMOGLOBIN A1C
Est. average glucose Bld gHb Est-mCnc: 169 mg/dL
Hgb A1c MFr Bld: 7.5 % — ABNORMAL HIGH (ref 4.8–5.6)

## 2022-02-07 LAB — CMP14+EGFR
ALT: 20 IU/L (ref 0–32)
AST: 12 IU/L (ref 0–40)
Albumin/Globulin Ratio: 1.4 (ref 1.2–2.2)
Albumin: 4.2 g/dL (ref 3.8–4.8)
Alkaline Phosphatase: 79 IU/L (ref 44–121)
BUN/Creatinine Ratio: 22 (ref 12–28)
BUN: 19 mg/dL (ref 8–27)
Bilirubin Total: 0.3 mg/dL (ref 0.0–1.2)
CO2: 25 mmol/L (ref 20–29)
Calcium: 9.2 mg/dL (ref 8.7–10.3)
Chloride: 106 mmol/L (ref 96–106)
Creatinine, Ser: 0.87 mg/dL (ref 0.57–1.00)
Globulin, Total: 2.9 g/dL (ref 1.5–4.5)
Glucose: 102 mg/dL — ABNORMAL HIGH (ref 70–99)
Potassium: 4.5 mmol/L (ref 3.5–5.2)
Sodium: 145 mmol/L — ABNORMAL HIGH (ref 134–144)
Total Protein: 7.1 g/dL (ref 6.0–8.5)
eGFR: 72 mL/min/{1.73_m2} (ref >59)

## 2022-02-07 LAB — URIC ACID: Uric Acid: 6 mg/dL (ref 3.0–7.2)

## 2022-02-07 MED ORDER — DAPAGLIFLOZIN PROPANEDIOL 10 MG PO TABS: 10.0000 mg | ORAL_TABLET | Freq: Every day | ORAL | 2 refills | Status: DC

## 2022-02-11 ENCOUNTER — Telehealth: Payer: Medicare Other

## 2022-02-11 ENCOUNTER — Telehealth: Payer: Self-pay

## 2022-02-11 NOTE — Telephone Encounter (Signed)
  Care Management   Follow Up Note   02/11/2022 Name: Tanya Levy MRN: [id number] DOB: 05-Feb-1951   Referred by: Dorothyann Peng, MD Reason for referral : Chronic Care Management (RN CM Follow up call )   An unsuccessful telephone outreach was attempted today. The patient was referred to the case management team for assistance with care management and care coordination.   Follow Up Plan: Telephone follow up appointment with care management team member scheduled for: 02/25/22  Delsa Sale, RN, BSN, CCM Care Management Coordinator [facility] Care Management/[facility]  Direct Phone: [phone]

## 2022-02-25 ENCOUNTER — Telehealth: Payer: Medicare Other

## 2022-02-25 ENCOUNTER — Ambulatory Visit: Payer: Self-pay

## 2022-02-25 DIAGNOSIS — I10 Essential (primary) hypertension: Secondary | ICD-10-CM

## 2022-02-25 DIAGNOSIS — E039 Hypothyroidism, unspecified: Secondary | ICD-10-CM

## 2022-02-25 DIAGNOSIS — E1165 Type 2 diabetes mellitus with hyperglycemia: Secondary | ICD-10-CM

## 2022-02-25 NOTE — Chronic Care Management (AMB) (Signed)
  Care Management   Follow Up Note   02/25/2022 Name: Tanya Levy MRN: [id number] DOB: October 02, 1950   Referred by: Dorothyann Peng, MD Reason for referral : Chronic Care Management (RN CM follow up call)   Successful contact was made with the patient to discuss care management and care coordination services. Patient declines engagement at this time.   Follow Up Plan: CCM enrollment status changed to "previously enrolled" as per patient request on 02/25/22 to discontinue enrollment. Case closed to case management services in primary care home.   Delsa Sale, RN, BSN, CCM Care Management Coordinator [facility] Care Management/[facility]  Direct Phone: [phone]

## 2022-03-22 ENCOUNTER — Other Ambulatory Visit: Payer: Self-pay | Admitting: Internal Medicine

## 2022-05-01 ENCOUNTER — Ambulatory Visit (INDEPENDENT_AMBULATORY_CARE_PROVIDER_SITE_OTHER): Payer: Medicare Other

## 2022-05-01 ENCOUNTER — Ambulatory Visit: Payer: Medicare Other | Admitting: Internal Medicine

## 2022-05-01 VITALS — Ht 60.0 in | Wt 172.0 lb

## 2022-05-01 DIAGNOSIS — Z0000 Encounter for general adult medical examination without abnormal findings: Secondary | ICD-10-CM

## 2022-05-01 NOTE — Progress Notes (Signed)
I connected with Tanya Levy today by telephone and verified that I am speaking with the correct person using two identifiers. Location patient: home Location provider: work Persons participating in the virtual visit: Tanya Levy, [employer] LPN.   I discussed the limitations, risks, security and privacy concerns of performing an evaluation and management service by telephone and the availability of in person appointments. I also discussed with the patient that there may be a patient responsible charge related to this service. The patient expressed understanding and verbally consented to this telephonic visit.    Interactive audio and video telecommunications were attempted between this provider and patient, however failed, due to patient having technical difficulties OR patient did not have access to video capability.  We continued and completed visit with audio only.     Vital signs may be patient reported or missing.  Subjective:   Tanya Levy is a 71 y.o. female who presents for Medicare Annual (Subsequent) preventive examination.  Review of Systems     Cardiac Risk Factors include: advanced age (>73men, >23 women);diabetes mellitus;hypertension;obesity (BMI >30kg/m2)     Objective:    Today's Vitals   05/01/22 0935  Weight: 172 lb (78 kg)  Height: 5' (1.524 m)   Body mass index is 33.59 kg/m.     05/01/2022    9:39 AM 03/28/2021    9:16 AM 03/26/2020   12:44 PM 01/01/2016    9:19 PM  Advanced Directives  Does Patient Have a Medical Advance Directive? No No No No  Would patient like information on creating a medical advance directive?   Yes (MAU/Ambulatory/Procedural Areas - Information given)     Current Medications (verified) Outpatient Encounter Medications as of 05/01/2022  Medication Sig   aspirin 81 MG EC tablet Take 81 mg by mouth daily. Swallow whole.   b complex vitamins tablet Take 1 tablet by mouth daily. 3 times per week    CRESTOR 20 MG tablet TAKE 1 TABLET BY MOUTH EVERY DAY   dapagliflozin propanediol (FARXIGA) 10 MG TABS tablet Take 1 tablet (10 mg total) by mouth daily before breakfast.   diphenhydrAMINE (BENADRYL) 25 MG tablet Take 1 tablet (25 mg total) by mouth every 6 (six) hours as needed for itching or allergies (Rash).   dorzolamide-timolol (COSOPT) 22.3-6.8 MG/ML ophthalmic solution SMARTSIG:In Eye(s)   EPINEPHrine 0.3 mg/0.3 mL IJ SOAJ injection EpiPen 2-Pak 0.3 mg/0.3 mL injection, auto-injector   fluticasone (FLONASE) 50 MCG/ACT nasal spray SPRAY 2 SPRAYS INTO EACH NOSTRIL EVERY DAY   latanoprost (XALATAN) 0.005 % ophthalmic solution    levothyroxine (SYNTHROID) 100 MCG tablet TAKE 1 TABLET BY MOUTH EVERY DAY   OZEMPIC, 1 MG/DOSE, 4 MG/3ML SOPN INJECT 1MG  INTO THE SKIN ONCE A WEEK   valsartan-hydrochlorothiazide (DIOVAN-HCT) 160-12.5 MG tablet TAKE 1 TABLET BY MOUTH EVERY DAY   No facility-administered encounter medications on file as of 05/01/2022.    Allergies (verified) Shellfish allergy and Shellfish allergy   History: Past Medical History:  Diagnosis Date   Diabetes mellitus (HCC)    Diabetes mellitus without complication (HCC)    History of chicken pox    History of measles    History of mumps    Hypertension    Tilted uterus    UTI (urinary tract infection)    Past Surgical History:  Procedure Laterality Date   EYE SURGERY     KNEE ARTHROSCOPY     KNEE SURGERY     Family History  Problem Relation Age of  Onset   Hypertension Mother    Hypertension Father    Social History   Socioeconomic History   Marital status: Married    Spouse name: Not on file   Number of children: Not on file   Years of education: Not on file   Highest education level: Not on file  Occupational History   Not on file  Tobacco Use   Smoking status: Never   Smokeless tobacco: Never  Vaping Use   Vaping Use: Never used  Substance and Sexual Activity   Alcohol use: Yes    Comment: occasional    Drug use: No   Sexual activity: Yes    Birth control/protection: Post-menopausal  Other Topics Concern   Not on file  Social History Narrative   ** Merged History Encounter **       Social Determinants of Health   Financial Resource Strain: Low Risk  (05/01/2022)   Overall Financial Resource Strain (CARDIA)    Difficulty of Paying Living Expenses: Not hard at all  Food Insecurity: No Food Insecurity (05/01/2022)   Hunger Vital Sign    Worried About Running Out of Food in the Last Year: Never true    Ran Out of Food in the Last Year: Never true  Transportation Needs: No Transportation Needs (05/01/2022)   PRAPARE - Administrator, Civil Service (Medical): No    Lack of Transportation (Non-Medical): No  Physical Activity: Inactive (05/01/2022)   Exercise Vital Sign    Days of Exercise per Week: 0 days    Minutes of Exercise per Session: 0 min  Stress: No Stress Concern Present (05/01/2022)   [employer] of Occupational Health - Occupational Stress Questionnaire    Feeling of Stress : Not at all  Social Connections: Not on file    Tobacco Counseling Counseling given: Not Answered   Clinical Intake:  Pre-visit preparation completed: Yes  Pain : No/denies pain     Nutritional Status: BMI > 30  Obese Nutritional Risks: None Diabetes: Yes  How often do you need to have someone help you when you read instructions, pamphlets, or other written materials from your doctor or pharmacy?: 1 - Never What is the last grade level you completed in school?: masters degree  Diabetic? Yes Nutrition Risk Assessment:  Has the patient had any N/V/D within the last 2 months?  No  Does the patient have any non-healing wounds?  No  Has the patient had any unintentional weight loss or weight gain?  No   Diabetes:  Is the patient diabetic?  Yes  If diabetic, was a CBG obtained today?  No  Did the patient bring in their glucometer from home?  No  How often do you  monitor your CBG's? daily.   Financial Strains and Diabetes Management:  Are you having any financial strains with the device, your supplies or your medication? No .  Does the patient want to be seen by Chronic Care Management for management of their diabetes?  No  Would the patient like to be referred to a Nutritionist or for Diabetic Management?  No   Diabetic Exams:  Diabetic Eye Exam: Completed 05/14/2021 Diabetic Foot Exam: Completed 06/27/2021   Interpreter Needed?: No  Information entered by :: NAllen LPN   Activities of Daily Living    05/01/2022    9:39 AM  In your present state of health, do you have any difficulty performing the following activities:  Hearing? 0  Vision? 0  Difficulty concentrating  or making decisions? 0  Walking or climbing stairs? 1  Comment due to the tendonitis  Dressing or bathing? 0  Doing errands, shopping? 0  Preparing Food and eating ? N  Using the Toilet? N  In the past six months, have you accidently leaked urine? N  Do you have problems with loss of bowel control? N  Managing your Medications? N  Managing your Finances? N  Housekeeping or managing your Housekeeping? N    Patient Care Team: Dorothyann PengSanders, Robyn, MD as PCP - General (Internal Medicine) Dorothyann PengSanders, Robyn, MD (Internal Medicine)  Indicate any recent Medical Services you may have received from other than [facility] providers in the past year (date may be approximate).     Assessment:   This is a routine wellness examination for Tanya Levy.  Hearing/Vision screen Vision Screening - Comments:: Regular eye exams, Dr. Burgess Estelleanner  Dietary issues and exercise activities discussed: Current Exercise Habits: The patient does not participate in regular exercise at present   Goals Addressed             This Visit's Progress    Patient Stated       05/01/2022, continue to try and lose weight       Depression Screen    05/01/2022    9:39 AM 03/28/2021    9:16 AM 10/31/2020    8:49 AM  08/17/2019   12:06 PM 07/05/2019   11:09 AM 02/14/2019   11:28 AM 08/24/2018   11:59 AM  PHQ 2/9 Scores  PHQ - 2 Score 0 0 0 0 0 0 0    Fall Risk    05/01/2022    9:39 AM 03/28/2021    9:16 AM 10/31/2020    8:51 AM 08/17/2019   12:06 PM 07/05/2019   11:08 AM  Fall Risk   Falls in the past year? 0 0 0 0 0  Number falls in past yr: 0      Injury with Fall? 0      Risk for fall due to : Medication side effect Medication side effect     Follow up Falls evaluation completed;Education provided;Falls prevention discussed Falls evaluation completed;Education provided;Falls prevention discussed       FALL RISK PREVENTION PERTAINING TO THE HOME:  Any stairs in or around the home? No  If so, are there any without handrails? N/a Home free of loose throw rugs in walkways, pet beds, electrical cords, etc? Yes  Adequate lighting in your home to reduce risk of falls? Yes   ASSISTIVE DEVICES UTILIZED TO PREVENT FALLS:  Life alert? No  Use of a cane, walker or w/c? No  Grab bars in the bathroom? Yes  Shower chair or bench in shower? No  Elevated toilet seat or a handicapped toilet? Yes   TIMED UP AND GO:  Was the test performed? No .      Cognitive Function:        05/01/2022    9:41 AM 03/28/2021    9:17 AM 03/26/2020   12:00 PM  6CIT Screen  What Year? 0 points 0 points 0 points  What month? 0 points 0 points 0 points  What time? 0 points 0 points 0 points  Count back from 20 0 points 0 points 2 points  Months in reverse 0 points 0 points 0 points  Repeat phrase 0 points 0 points 0 points  Total Score 0 points 0 points 2 points    Immunizations Immunization History  Administered Date(s) Administered  DTaP 04/19/2012   Fluad Quad(high Dose 65+) 07/31/2021   Influenza Inj Mdck Quad Pf 06/21/2019   Influenza Nasal 06/24/2018   Influenza, High Dose Seasonal PF 07/17/2020   Influenza,inj,Quad PF,6+ Mos 07/02/2018   Influenza-Unspecified 07/02/2018, 06/21/2019   PFIZER  Comirnaty(Gray Top)Covid-19 Tri-Sucrose Vaccine 12/25/2020   PFIZER(Purple Top)SARS-COV-2 Vaccination 10/04/2019, 10/25/2019, 05/22/2020   PNEUMOCOCCAL CONJUGATE-20 11/12/2020   Pfizer Covid-19 Vaccine Bivalent Booster 57yrs & up 07/01/2021   Pneumococcal Conjugate-13 07/05/2019   Pneumococcal-Unspecified 11/12/2020   Zoster Recombinat (Shingrix) 08/03/2020, 04/26/2021    TDAP status: Due, Education has been provided regarding the importance of this vaccine. Advised may receive this vaccine at local pharmacy or Health Dept. Aware to provide a copy of the vaccination record if obtained from local pharmacy or Health Dept. Verbalized acceptance and understanding.  Flu Vaccine status: Due, Education has been provided regarding the importance of this vaccine. Advised may receive this vaccine at local pharmacy or Health Dept. Aware to provide a copy of the vaccination record if obtained from local pharmacy or Health Dept. Verbalized acceptance and understanding.  Pneumococcal vaccine status: Up to date  Covid-19 vaccine status: Completed vaccines  Qualifies for Shingles Vaccine? Yes   Zostavax completed Yes   Shingrix Completed?: Yes  Screening Tests Health Maintenance  Topic Date Due   COVID-19 Vaccine (6 - Pfizer risk series) 08/26/2021   TETANUS/TDAP  04/19/2022   INFLUENZA VACCINE  04/15/2022   OPHTHALMOLOGY EXAM  05/14/2022   FOOT EXAM  06/27/2022   HEMOGLOBIN A1C  08/09/2022   MAMMOGRAM  03/27/2023   COLONOSCOPY (Pts 45-32yrs Insurance coverage will need to be confirmed)  08/02/2027   Pneumonia Vaccine 52+ Years old  Completed   DEXA SCAN  Completed   Hepatitis C Screening  Completed   Zoster Vaccines- Shingrix  Completed   HPV VACCINES  Aged Out    Health Maintenance  Health Maintenance Due  Topic Date Due   COVID-19 Vaccine (6 - Pfizer risk series) 08/26/2021   TETANUS/TDAP  04/19/2022   INFLUENZA VACCINE  04/15/2022    Colorectal cancer screening: Type of screening:  Colonoscopy. Completed 08/01/2017. Repeat every 10 years  Mammogram status: patient to schedule  Bone Density status: Completed 05/20/2018.   Lung Cancer Screening: (Low Dose CT Chest recommended if Age 90-80 years, 30 pack-year currently smoking OR have quit w/in 15years.) does not qualify.   Lung Cancer Screening Referral: no  Additional Screening:  Hepatitis C Screening: does qualify; Completed 09/01/2012  Vision Screening: Recommended annual ophthalmology exams for early detection of glaucoma and other disorders of the eye. Is the patient up to date with their annual eye exam?  Yes  Who is the provider or what is the name of the office in which the patient attends annual eye exams? Dr. Burgess Estelle If pt is not established with a provider, would they like to be referred to a provider to establish care? No .   Dental Screening: Recommended annual dental exams for proper oral hygiene  Community Resource Referral / Chronic Care Management: CRR required this visit?  No   CCM required this visit?  No      Plan:     I have personally reviewed and noted the following in the patient's chart:   Medical and social history Use of alcohol, tobacco or illicit drugs  Current medications and supplements including opioid prescriptions.  Functional ability and status Nutritional status Physical activity Advanced directives List of other physicians Hospitalizations, surgeries, and ER visits in previous 12  months Vitals Screenings to include cognitive, depression, and falls Referrals and appointments  In addition, I have reviewed and discussed with patient certain preventive protocols, quality metrics, and best practice recommendations. A written personalized care plan for preventive services as well as general preventive health recommendations were provided to patient.     Barb Merino, LPN   1/74/0814   Nurse Notes: none  Due to this being a virtual visit, the after visit summary  with patients personalized plan was offered to patient via mail or my-chart.  to pick up at office at next visit

## 2022-05-01 NOTE — Patient Instructions (Signed)
Tanya Levy , Thank you for taking time to come for your Medicare Wellness Visit. I appreciate your ongoing commitment to your health goals. Please review the following plan we discussed and let me know if I can assist you in the future.   Screening recommendations/referrals: Colonoscopy: completed 08/01/2017, due 08/02/2027 Mammogram: patient to schedule Bone Density: completed 05/20/2018 Recommended yearly ophthalmology/optometry visit for glaucoma screening and checkup Recommended yearly dental visit for hygiene and checkup  Vaccinations: Influenza vaccine: due Pneumococcal vaccine: completed 11/12/2020 Tdap vaccine: due Shingles vaccine: completed   Covid-19: 07/01/2021, 12/25/2020, 05/22/2020, 10/25/2019, 10/04/2019  Advanced directives: Advance directive discussed with you today.   Conditions/risks identified: none  Next appointment: Follow up in one year for your annual wellness visit    Preventive Care 65 Years and Older, Female Preventive care refers to lifestyle choices and visits with your health care provider that can promote health and wellness. What does preventive care include? A yearly physical exam. This is also called an annual well check. Dental exams once or twice a year. Routine eye exams. Ask your health care provider how often you should have your eyes checked. Personal lifestyle choices, including: Daily care of your teeth and gums. Regular physical activity. Eating a healthy diet. Avoiding tobacco and drug use. Limiting alcohol use. Practicing safe sex. Taking low-dose aspirin every day. Taking vitamin and mineral supplements as recommended by your health care provider. What happens during an annual well check? The services and screenings done by your health care provider during your annual well check will depend on your age, overall health, lifestyle risk factors, and family history of disease. Counseling  Your health care provider may ask you  questions about your: Alcohol use. Tobacco use. Drug use. Emotional well-being. Home and relationship well-being. Sexual activity. Eating habits. History of falls. Memory and ability to understand (cognition). Work and work Astronomer. Reproductive health. Screening  You may have the following tests or measurements: Height, weight, and BMI. Blood pressure. Lipid and cholesterol levels. These may be checked every 5 years, or more frequently if you are over 48 years old. Skin check. Lung cancer screening. You may have this screening every year starting at age 36 if you have a 30-pack-year history of smoking and currently smoke or have quit within the past 15 years. Fecal occult blood test (FOBT) of the stool. You may have this test every year starting at age 58. Flexible sigmoidoscopy or colonoscopy. You may have a sigmoidoscopy every 5 years or a colonoscopy every 10 years starting at age 56. Hepatitis C blood test. Hepatitis B blood test. Sexually transmitted disease (STD) testing. Diabetes screening. This is done by checking your blood sugar (glucose) after you have not eaten for a while (fasting). You may have this done every 1-3 years. Bone density scan. This is done to screen for osteoporosis. You may have this done starting at age 72. Mammogram. This may be done every 1-2 years. Talk to your health care provider about how often you should have regular mammograms. Talk with your health care provider about your test results, treatment options, and if necessary, the need for more tests. Vaccines  Your health care provider may recommend certain vaccines, such as: Influenza vaccine. This is recommended every year. Tetanus, diphtheria, and acellular pertussis (Tdap, Td) vaccine. You may need a Td booster every 10 years. Zoster vaccine. You may need this after age 2. Pneumococcal 13-valent conjugate (PCV13) vaccine. One dose is recommended after age 5. Pneumococcal polysaccharide  (PPSV23) vaccine.  One dose is recommended after age 15. Talk to your health care provider about which screenings and vaccines you need and how often you need them. This information is not intended to replace advice given to you by your health care provider. Make sure you discuss any questions you have with your health care provider. Document Released: 09/28/2015 Document Revised: 05/21/2016 Document Reviewed: 07/03/2015 Elsevier Interactive Patient Education  2017 [place] Prevention in the Home Falls can cause injuries. They can happen to people of all ages. There are many things you can do to make your home safe and to help prevent falls. What can I do on the outside of my home? Regularly fix the edges of walkways and driveways and fix any cracks. Remove anything that might make you trip as you walk through a door, such as a raised step or threshold. Trim any bushes or trees on the path to your home. Use bright outdoor lighting. Clear any walking paths of anything that might make someone trip, such as rocks or tools. Regularly check to see if handrails are loose or broken. Make sure that both sides of any steps have handrails. Any raised decks and porches should have guardrails on the edges. Have any leaves, snow, or ice cleared regularly. Use sand or salt on walking paths during winter. Clean up any spills in your garage right away. This includes oil or grease spills. What can I do in the bathroom? Use night lights. Install grab bars by the toilet and in the tub and shower. Do not use towel bars as grab bars. Use non-skid mats or decals in the tub or shower. If you need to sit down in the shower, use a plastic, non-slip stool. Keep the floor dry. Clean up any water that spills on the floor as soon as it happens. Remove soap buildup in the tub or shower regularly. Attach bath mats securely with double-sided non-slip rug tape. Do not have throw rugs and other things on the  floor that can make you trip. What can I do in the bedroom? Use night lights. Make sure that you have a light by your bed that is easy to reach. Do not use any sheets or blankets that are too big for your bed. They should not hang down onto the floor. Have a firm chair that has side arms. You can use this for support while you get dressed. Do not have throw rugs and other things on the floor that can make you trip. What can I do in the kitchen? Clean up any spills right away. Avoid walking on wet floors. Keep items that you use a lot in easy-to-reach places. If you need to reach something above you, use a strong step stool that has a grab bar. Keep electrical cords out of the way. Do not use floor polish or wax that makes floors slippery. If you must use wax, use non-skid floor wax. Do not have throw rugs and other things on the floor that can make you trip. What can I do with my stairs? Do not leave any items on the stairs. Make sure that there are handrails on both sides of the stairs and use them. Fix handrails that are broken or loose. Make sure that handrails are as long as the stairways. Check any carpeting to make sure that it is firmly attached to the stairs. Fix any carpet that is loose or worn. Avoid having throw rugs at the top or bottom of the  stairs. If you do have throw rugs, attach them to the floor with carpet tape. Make sure that you have a light switch at the top of the stairs and the bottom of the stairs. If you do not have them, ask someone to add them for you. What else can I do to help prevent falls? Wear shoes that: Do not have high heels. Have rubber bottoms. Are comfortable and fit you well. Are closed at the toe. Do not wear sandals. If you use a stepladder: Make sure that it is fully opened. Do not climb a closed stepladder. Make sure that both sides of the stepladder are locked into place. Ask someone to hold it for you, if possible. Clearly mark and make  sure that you can see: Any grab bars or handrails. First and last steps. Where the edge of each step is. Use tools that help you move around (mobility aids) if they are needed. These include: Canes. Walkers. Scooters. Crutches. Turn on the lights when you go into a dark area. Replace any light bulbs as soon as they burn out. Set up your furniture so you have a clear path. Avoid moving your furniture around. If any of your floors are uneven, fix them. If there are any pets around you, be aware of where they are. Review your medicines with your doctor. Some medicines can make you feel dizzy. This can increase your chance of falling. Ask your doctor what other things that you can do to help prevent falls. This information is not intended to replace advice given to you by your health care provider. Make sure you discuss any questions you have with your health care provider. Document Released: 06/28/2009 Document Revised: 02/07/2016 Document Reviewed: 10/06/2014 Elsevier Interactive Patient Education  2017 [employer].

## 2022-05-08 ENCOUNTER — Other Ambulatory Visit: Payer: Self-pay | Admitting: Internal Medicine

## 2022-05-14 ENCOUNTER — Ambulatory Visit (INDEPENDENT_AMBULATORY_CARE_PROVIDER_SITE_OTHER): Payer: Medicare Other | Admitting: Internal Medicine

## 2022-05-14 ENCOUNTER — Encounter: Payer: Self-pay | Admitting: Internal Medicine

## 2022-05-14 VITALS — BP 122/74 | Temp 98.1°F | Ht 60.0 in | Wt 172.2 lb

## 2022-05-14 DIAGNOSIS — E039 Hypothyroidism, unspecified: Secondary | ICD-10-CM

## 2022-05-14 DIAGNOSIS — I10 Essential (primary) hypertension: Secondary | ICD-10-CM

## 2022-05-14 DIAGNOSIS — Z6833 Body mass index (BMI) 33.0-33.9, adult: Secondary | ICD-10-CM | POA: Diagnosis not present

## 2022-05-14 DIAGNOSIS — E1165 Type 2 diabetes mellitus with hyperglycemia: Secondary | ICD-10-CM | POA: Diagnosis not present

## 2022-05-14 DIAGNOSIS — Z23 Encounter for immunization: Secondary | ICD-10-CM

## 2022-05-14 DIAGNOSIS — E6609 Other obesity due to excess calories: Secondary | ICD-10-CM

## 2022-05-14 MED ORDER — BOOSTRIX 5-2.5-18.5 LF-MCG/0.5 IM SUSP: 0.5000 mL | Freq: Once | INTRAMUSCULAR | 0 refills | Status: AC

## 2022-05-14 NOTE — Progress Notes (Signed)
I,Tanya Levy,acting as a scribe for [name], MD.,have documented all relevant documentation on the behalf of [name], MD,as directed by  [name], MD while in the presence of [name], MD.    Subjective:     Patient ID: Tanya Levy , female    DOB: Apr 08, 1951 , 71 y.o.   MRN: [id number]   Chief Complaint  Patient presents with   Diabetes   Hypertension    HPI  The patient is here today for a diabetes and bp check.  She was started on Farxiga 3m at her last visit. She has tolerated the medication without any problems. She reports compliance with meds. She denies headaches, chest pain and shortness of breath.   She is happy to report she has eye exam scheduled, with Dr. TSatira Sark( Ophthalmologist) on September 22ND.     Diabetes She presents for her follow-up diabetic visit. She has type 2 diabetes mellitus. Her disease course has been improving. There are no hypoglycemic associated symptoms. Pertinent negatives for hypoglycemia include no headaches. Pertinent negatives for diabetes include no chest pain, no fatigue, no polydipsia, no polyphagia, no polyuria and no weakness. There are no hypoglycemic complications. Risk factors for coronary artery disease include diabetes mellitus, dyslipidemia, hypertension, obesity, sedentary lifestyle and post-menopausal. She is compliant with treatment most of the time. She is following a diabetic diet. She participates in exercise intermittently. Her breakfast blood glucose is taken between 8-9 am. Her breakfast blood glucose range is generally 110-130 mg/dl. An ACE inhibitor/angiotensin II receptor blocker is being taken. Eye exam is current.  Hypertension This is a chronic problem. The current episode started more than 1 year ago. The problem has been gradually improving since onset. The problem is controlled. Pertinent negatives include no chest pain or headaches. Risk factors for coronary artery  disease include diabetes mellitus, dyslipidemia, obesity, sedentary lifestyle and post-menopausal state. Compliance problems include exercise.      Past Medical History:  Diagnosis Date   Diabetes mellitus ([facility]    Diabetes mellitus without complication ([facility]    History of chicken pox    History of measles    History of mumps    Hypertension    Tilted uterus    UTI (urinary tract infection)      Family History  Problem Relation Age of Onset   Hypertension Mother    Hypertension Father      Current Outpatient Medications:    aspirin 81 MG EC tablet, Take 81 mg by mouth daily. Swallow whole., Disp: , Rfl:    b complex vitamins tablet, Take 1 tablet by mouth daily. 3 times per week, Disp: , Rfl:    CRESTOR 20 MG tablet, TAKE 1 TABLET BY MOUTH EVERY DAY, Disp: 90 tablet, Rfl: 3   dapagliflozin propanediol (FARXIGA) 10 MG TABS tablet, Take 1 tablet (10 mg total) by mouth daily before breakfast., Disp: 90 tablet, Rfl: 2   diphenhydrAMINE (BENADRYL) 25 MG tablet, Take 1 tablet (25 mg total) by mouth every 6 (six) hours as needed for itching or allergies (Rash)., Disp: 30 tablet, Rfl: 0   dorzolamide-timolol (COSOPT) 22.3-6.8 MG/ML ophthalmic solution, SMARTSIG:In Eye(s), Disp: , Rfl:    EPINEPHrine 0.3 mg/0.3 mL IJ SOAJ injection, EpiPen 2-Pak 0.3 mg/0.3 mL injection, auto-injector, Disp: , Rfl:    fluticasone (FLONASE) 50 MCG/ACT nasal spray, SPRAY 2 SPRAYS INTO EACH NOSTRIL EVERY DAY, Disp: 48 mL, Rfl: 2   latanoprost (XALATAN) 0.005 % ophthalmic solution, ,  Disp: , Rfl:    levothyroxine (SYNTHROID) 100 MCG tablet, TAKE 1 TABLET BY MOUTH EVERY DAY, Disp: 90 tablet, Rfl: 1   OZEMPIC, 1 MG/DOSE, 4 MG/3ML SOPN, INJECT 1MG INTO THE SKIN ONCE A WEEK, Disp: 9 mL, Rfl: 2   valsartan-hydrochlorothiazide (DIOVAN-HCT) 160-12.5 MG tablet, TAKE 1 TABLET BY MOUTH EVERY DAY, Disp: 90 tablet, Rfl: 2   Allergies  Allergen Reactions   Shellfish Allergy Anaphylaxis   Shellfish Allergy      Review  of Systems  Constitutional: Negative.  Negative for fatigue.  Respiratory: Negative.    Cardiovascular: Negative.  Negative for chest pain.  Endocrine: Negative for polydipsia, polyphagia and polyuria.  Neurological: Negative.  Negative for weakness and headaches.  Psychiatric/Behavioral: Negative.       Today's Vitals   05/14/22 1151  BP: 122/74  Temp: 98.1 F (36.7 C)  SpO2: 98%  Weight: 172 lb 3.2 oz (78.1 kg)  Height: 5' (1.524 m)  PainSc: 0-No pain   Body mass index is 33.63 kg/m.  Wt Readings from Last 3 Encounters:  05/14/22 172 lb 3.2 oz (78.1 kg)  05/01/22 172 lb (78 kg)  02/06/22 174 lb (78.9 kg)    Objective:  Physical Exam Vitals and nursing note reviewed.  Constitutional:      Appearance: Normal appearance.  HENT:     Head: Normocephalic and atraumatic.  Eyes:     Extraocular Movements: Extraocular movements intact.  Cardiovascular:     Rate and Rhythm: Normal rate and regular rhythm.     Heart sounds: Normal heart sounds.  Pulmonary:     Effort: Pulmonary effort is normal.     Breath sounds: Normal breath sounds.  Musculoskeletal:     Cervical back: Normal range of motion.  Skin:    General: Skin is warm.  Neurological:     General: No focal deficit present.     Mental Status: She is alert.  Psychiatric:        Mood and Affect: Mood normal.        Behavior: Behavior normal.         Assessment And Plan:     1. Uncontrolled type 2 diabetes mellitus with hyperglycemia ([place]) Comments: Chronic, I will check renal function since she has started SGLT2-inh therapy, Farxiga 30m daily.  - Urine microalbumin-creatinine with uACR - Renal function panel with eGFR - BMP8+eGFR - Hemoglobin A1c  2. Essential hypertension, benign Comments: Chronic, well controlled.   3. Class 1 obesity due to excess calories without serious comorbidity with body mass index (BMI) of 33.0 to 33.9 in adult Comments: She is encouraged to strive for BMI less than 30 to  decrease cardiac risk. Advised to aim for at least 150 minutes of exercise per week.    4. Immunization due Comments: I will send rx Tdap to her local pharmacy. She is encouraged to get this within two weeks.   Patient was given opportunity to ask questions. Patient verbalized understanding of the plan and was able to repeat key elements of the plan. All questions were answered to their satisfaction.   I, RMaximino Greenland MD, have reviewed all documentation for this visit. The documentation on 05/19/22 for the exam, diagnosis, procedures, and orders are all accurate and complete.   IF YOU HAVE BEEN REFERRED TO A SPECIALIST, IT MAY TAKE 1-2 WEEKS TO SCHEDULE/PROCESS THE REFERRAL. IF YOU HAVE NOT HEARD FROM US/SPECIALIST IN TWO WEEKS, PLEASE GIVE [place] CALL AT [phone].   THE  PATIENT IS ENCOURAGED TO PRACTICE SOCIAL DISTANCING DUE TO THE COVID-19 PANDEMIC.   

## 2022-05-14 NOTE — Patient Instructions (Signed)

## 2022-05-15 LAB — BMP8+EGFR
BUN/Creatinine Ratio: 17 (ref 12–28)
BUN: 15 mg/dL (ref 8–27)
CO2: 23 mmol/L (ref 20–29)
Calcium: 9.6 mg/dL (ref 8.7–10.3)
Chloride: 103 mmol/L (ref 96–106)
Creatinine, Ser: 0.87 mg/dL (ref 0.57–1.00)
Glucose: 99 mg/dL (ref 70–99)
Potassium: 4.4 mmol/L (ref 3.5–5.2)
Sodium: 143 mmol/L (ref 134–144)
eGFR: 72 mL/min/{1.73_m2} (ref >59)

## 2022-05-15 LAB — RENAL FUNCTION PANEL
Albumin: 4.7 g/dL (ref 3.9–4.9)
Phosphorus: 3.1 mg/dL (ref 3.0–4.3)

## 2022-05-15 LAB — HEMOGLOBIN A1C
Est. average glucose Bld gHb Est-mCnc: 174 mg/dL
Hgb A1c MFr Bld: 7.7 % — ABNORMAL HIGH (ref 4.8–5.6)

## 2022-05-16 LAB — MICROALBUMIN / CREATININE URINE RATIO
Creatinine, Urine: 86.8 mg/dL
Microalb/Creat Ratio: <3 mg/g creat (ref 0–29)
Microalbumin, Urine: <3 ug/mL

## 2022-05-19 DIAGNOSIS — E039 Hypothyroidism, unspecified: Secondary | ICD-10-CM | POA: Insufficient documentation

## 2022-06-06 LAB — HM DIABETES EYE EXAM

## 2022-06-10 ENCOUNTER — Encounter: Payer: Self-pay | Admitting: Internal Medicine

## 2022-06-19 ENCOUNTER — Other Ambulatory Visit: Payer: Self-pay | Admitting: Internal Medicine

## 2022-06-21 ENCOUNTER — Other Ambulatory Visit: Payer: Self-pay | Admitting: Internal Medicine

## 2022-06-24 ENCOUNTER — Emergency Department (HOSPITAL_COMMUNITY)
Admission: EM | Admit: 2022-06-24 | Discharge: 2022-06-24 | Disposition: A | Discharge status: Home / Self Care | Payer: Medicare Other | Attending: Emergency Medicine | Admitting: Emergency Medicine

## 2022-06-24 ENCOUNTER — Other Ambulatory Visit: Payer: Self-pay

## 2022-06-24 ENCOUNTER — Emergency Department (HOSPITAL_COMMUNITY): Payer: Medicare Other

## 2022-06-24 DIAGNOSIS — Z7982 Long term (current) use of aspirin: Secondary | ICD-10-CM | POA: Diagnosis not present

## 2022-06-24 DIAGNOSIS — M545 Low back pain, unspecified: Secondary | ICD-10-CM | POA: Insufficient documentation

## 2022-06-24 DIAGNOSIS — M79605 Pain in left leg: Secondary | ICD-10-CM | POA: Diagnosis present

## 2022-06-24 MED ORDER — METHOCARBAMOL 500 MG PO TABS: 500.0000 mg | ORAL_TABLET | Freq: Two times a day (BID) | ORAL | 0 refills | Status: DC

## 2022-06-24 NOTE — ED Provider Triage Note (Signed)
Emergency Medicine Provider Triage Evaluation Note  Tanya Levy , a 71 y.o. female  was evaluated in triage.  Pt complains of left lower back pain onset Saturday while yard-saleing. Went home to rest and took Tylenol without relief. Switched to IBU Sunday which helped some. Went to UC told pulled muscle. Pain radiates into left thigh area, given prednisone, naproxen, flexeril. Pain seemed to improve. Pain worse yesterday, difficulty walking/sleeping due to pain. NO relief with warm bath, heating pad, ice.   Review of Systems  Positive: As above Negative: As above  Physical Exam  There were no vitals taken for this visit. Gen:   Awake, no distress   Resp:  Normal effort  MSK:   Moves extremities without difficulty  Other:  Tenderness along the mid lateral left thigh  Medical Decision Making  Medically screening exam initiated at 9:51 AM.  Appropriate orders placed.  Tanya Levy was informed that the remainder of the evaluation will be completed by another provider, this initial triage assessment does not replace that evaluation, and the importance of remaining in the ED until their evaluation is complete.     Tacy Learn, PA-C 06/24/22 1224

## 2022-06-24 NOTE — ED Triage Notes (Signed)
Pt. Stated, Im having aback and leg pain , went to UC and was given different medication but Im not any better.

## 2022-06-24 NOTE — ED Provider Notes (Signed)
[facility] EMERGENCY DEPARTMENT Provider Note   CSN: [id number] Arrival date & time: 06/24/22  7517     History  Chief Complaint  Patient presents with   Back Pain   Leg Pain    Tanya Levy is a 71 y.o. female.  Pt complains of left lower back pain onset Saturday while yard-saleing. Went home to rest and took Tylenol without relief. Switched to IBU Sunday which helped some. Went to UC told pulled muscle. Pain radiates into left thigh area, given prednisone, naproxen, flexeril. Pain seemed to improve. Pain worse yesterday, difficulty walking/sleeping due to pain. NO relief with warm bath, heating pad, ice.        Home Medications Prior to Admission medications   Medication Sig Start Date End Date Taking? Authorizing Provider  methocarbamol (ROBAXIN) 500 MG tablet Take 1 tablet (500 mg total) by mouth 2 (two) times daily. 06/24/22  Yes Tacy Learn, PA-C  aspirin 81 MG EC tablet Take 81 mg by mouth daily. Swallow whole.    [provider]  b complex vitamins tablet Take 1 tablet by mouth daily. 3 times per week    [provider]  CRESTOR 20 MG tablet TAKE 1 TABLET BY MOUTH EVERY DAY 03/22/22   Glendale Chard, MD  dapagliflozin propanediol (FARXIGA) 10 MG TABS tablet Take 1 tablet (10 mg total) by mouth daily before breakfast. 02/07/22   Glendale Chard, MD  diphenhydrAMINE (BENADRYL) 25 MG tablet Take 1 tablet (25 mg total) by mouth every 6 (six) hours as needed for itching or allergies (Rash). 01/01/16   Antonietta Breach, PA-C  dorzolamide-timolol (COSOPT) 22.3-6.8 MG/ML ophthalmic solution SMARTSIG:In Eye(s) 10/01/20   [provider]  EPINEPHrine 0.3 mg/0.3 mL IJ SOAJ injection EpiPen 2-Pak 0.3 mg/0.3 mL injection, auto-injector    [provider]  fluticasone (FLONASE) 50 MCG/ACT nasal spray SPRAY 2 SPRAYS INTO EACH NOSTRIL EVERY DAY 06/23/22   Glendale Chard, MD  latanoprost (XALATAN) 0.005 % ophthalmic solution   07/07/18   [provider]  levothyroxine (SYNTHROID) 100 MCG tablet TAKE 1 TABLET BY MOUTH EVERY DAY 05/08/22   Glendale Chard, MD  OZEMPIC, 1 MG/DOSE, 4 MG/3ML SOPN INJECT 1MG  INTO THE SKIN ONCE A WEEK 06/19/22   Glendale Chard, MD  valsartan-hydrochlorothiazide (DIOVAN-HCT) 160-12.5 MG tablet TAKE 1 TABLET BY MOUTH EVERY DAY 05/08/22   Glendale Chard, MD      Allergies    Shellfish allergy and Shellfish allergy    Review of Systems   Review of Systems Negative except as per HPI Physical Exam Updated Vital Signs BP (!) 141/83 (BP Location: Left Arm)   Pulse 96   Temp 98.6 F (37 C) (Oral)   Resp 17   Ht 5' (1.524 m)   Wt 77.1 kg   SpO2 98%   BMI 33.20 kg/m  Physical Exam Vitals and nursing note reviewed.  Constitutional:      General: She is not in acute distress.    Appearance: She is well-developed. She is not diaphoretic.  HENT:     Head: Normocephalic and atraumatic.  Cardiovascular:     Pulses: Normal pulses.  Pulmonary:     Effort: Pulmonary effort is normal.  Abdominal:     Palpations: Abdomen is soft.     Tenderness: There is no abdominal tenderness.  Musculoskeletal:        General: Tenderness present. No swelling, deformity or signs of injury.     Thoracic back: No tenderness or bony tenderness.  Lumbar back: No tenderness or bony tenderness.     Left knee: Normal.     Right lower leg: No edema.     Left lower leg: No edema.     Left ankle: Normal.       Legs:     Comments: Tenderness with palpation along the lateral aspect of the left mid thigh.  No overlying skin changes.  Pain somewhat worse with abduction of the leg.  Skin:    General: Skin is warm and dry.     Findings: No bruising, erythema or rash.  Neurological:     Mental Status: She is alert and oriented to person, place, and time.     Sensory: No sensory deficit.     Motor: No weakness.     Gait: Gait normal.  Psychiatric:        Behavior: Behavior normal.     ED Results /  Procedures / Treatments   Labs (all labs ordered are listed, but only abnormal results are displayed) Labs Reviewed - No data to display  EKG None  Radiology DG Femur Min 2 Views Left  Result Date: 06/24/2022 CLINICAL DATA:  Leg pain no known injury. EXAM: LEFT FEMUR 2 VIEWS COMPARISON:  None Available. FINDINGS: There is no evidence of fracture or other focal bone lesions. 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 Soft tissues are unremarkable. IMPRESSION: No acute osseous abnormality Electronically Signed   By: Dahlia Bailiff M.D.   On: 06/24/2022 10:33    Procedures Procedures    Medications Ordered in ED Medications - No data to display  ED Course/ Medical Decision Making/ A&P                           Medical Decision Making Amount and/or Complexity of Data Reviewed Radiology: ordered.  Risk Prescription drug management.   71 year old female presents with complaint of pain in her left lateral thigh.  States her pain started over the weekend while she was walking around at a yard sale, noted to be in her left lower back.  Patient went to urgent care where she was given prednisone, naproxen, Flexeril.  Patient felt some improvement with these medications however now has pain in her left lateral thigh, her back pain has since resolved.  She denies any falls or injuries to the leg.  On exam, she is found to have tenderness to the mid lateral left thigh.  There is no pain in her calf, knee, ankle or hip.  No pain in her left lower back or left SI.  Reflexes are symmetric, gait intact.  X-ray of the left femur as ordered interpreted by myself is negative for acute bony injury.  Agree with radiologist interpretation.  Offered to switch patient from Flexeril to Robaxin.  Recommend that she follow-up with her primary care provider to discuss further treatment, possibly physical therapy.        Final Clinical Impression(s) / ED Diagnoses Final diagnoses:   Left leg pain    Rx / DC Orders ED Discharge Orders          Ordered    methocarbamol (ROBAXIN) 500 MG tablet  2 times daily        06/24/22 1101              Roque Lias 06/24/22 1223    Fredia Sorrow, MD 06/27/22 [phone]

## 2022-06-24 NOTE — Discharge Instructions (Signed)
Your x-ray today is normal.  I suspect this is a problem with the soft tissues/muscles/nerves in your leg and possibly back.  Discontinue the Flexeril.  You can take Robaxin as prescribed.  Do not drive or operate machinery if taking this medication as it can cause you to be unsteady.  Follow-up with your doctor, call today to schedule appointment.

## 2022-06-25 ENCOUNTER — Telehealth: Payer: Self-pay

## 2022-06-25 NOTE — Telephone Encounter (Signed)
Transition Care Management Follow-up Telephone Call Date of discharge and from where: 06/24/22 [place]  How have you been since you were released from the hospital? Pt states she is still having problems with walking.  Any questions or concerns? No  Items Reviewed: Did the pt receive and understand the discharge instructions provided? Yes  Medications obtained and verified? Yes  Other? No  Any new allergies since your discharge? No  Dietary orders reviewed? Yes Do you have support at home? Yes   Home Care and Equipment/Supplies: Were home health services ordered? no If so, what is the name of the agency? N/a  Has the agency set up a time to come to the patient's home? no Were any new equipment or medical supplies ordered?  No What is the name of the medical supply agency? N/a Were you able to get the supplies/equipment? no Do you have any questions related to the use of the equipment or supplies? No  Functional Questionnaire: (I = Independent and D = Dependent) ADLs: i  Bathing/Dressing- i  Meal Prep- i  Eating- i  Maintaining continence- i  Transferring/Ambulation- i  Managing Meds- i  Follow up appointments reviewed:  PCP Hospital f/u appt confirmed? Yes  Scheduled to see n/a on n/a @ n/a. [facility] f/u appt confirmed? Yes  Scheduled to see ortho on Monday 2:00PM  @ Dr Caprice Beaver. Are transportation arrangements needed? No  If their condition worsens, is the pt aware to call PCP or go to the Emergency Dept.? Yes Was the patient provided with contact information for the PCP's office or ED? Yes Was to pt encouraged to call back with questions or concerns? Yes

## 2022-08-05 ENCOUNTER — Ambulatory Visit: Payer: Medicare Other | Admitting: Internal Medicine

## 2022-08-13 ENCOUNTER — Ambulatory Visit: Payer: Medicare Other | Admitting: Internal Medicine

## 2022-08-19 ENCOUNTER — Other Ambulatory Visit: Payer: Self-pay | Admitting: Internal Medicine

## 2022-08-23 ENCOUNTER — Other Ambulatory Visit: Payer: Self-pay | Admitting: Internal Medicine

## 2022-09-01 ENCOUNTER — Ambulatory Visit (INDEPENDENT_AMBULATORY_CARE_PROVIDER_SITE_OTHER): Payer: Medicare Other | Admitting: Internal Medicine

## 2022-09-01 ENCOUNTER — Encounter: Payer: Self-pay | Admitting: Internal Medicine

## 2022-09-01 VITALS — BP 142/84 | HR 107 | Temp 98.5°F | Ht 60.0 in | Wt 169.4 lb

## 2022-09-01 DIAGNOSIS — Z23 Encounter for immunization: Secondary | ICD-10-CM | POA: Diagnosis not present

## 2022-09-01 DIAGNOSIS — I10 Essential (primary) hypertension: Secondary | ICD-10-CM | POA: Diagnosis not present

## 2022-09-01 DIAGNOSIS — Z6833 Body mass index (BMI) 33.0-33.9, adult: Secondary | ICD-10-CM | POA: Diagnosis not present

## 2022-09-01 DIAGNOSIS — E1165 Type 2 diabetes mellitus with hyperglycemia: Secondary | ICD-10-CM

## 2022-09-01 DIAGNOSIS — E6609 Other obesity due to excess calories: Secondary | ICD-10-CM

## 2022-09-01 MED ORDER — TETANUS-DIPHTH-ACELL PERTUSSIS 5-2.5-18.5 LF-MCG/0.5 IM SUSP: 0.5000 mL | Freq: Once | INTRAMUSCULAR | 0 refills | Status: AC

## 2022-09-01 MED ORDER — AMLODIPINE BESYLATE 2.5 MG PO TABS: 2.5000 mg | ORAL_TABLET | Freq: Every day | ORAL | 11 refills | Status: DC

## 2022-09-01 NOTE — Patient Instructions (Signed)

## 2022-09-01 NOTE — Progress Notes (Signed)
c I,Jameka J Llittleton,acting as a Education administrator for [name], MD.,have documented all relevant documentation on the behalf of [name], MD,as directed by  [name], MD while in the presence of [name], MD.    Subjective:     Patient ID: Tanya Levy , female    DOB: 1951/08/04 , 71 y.o.   MRN: [id number]   Chief Complaint  Patient presents with   Diabetes   Hypertension    HPI  The patient is here today for a diabetes and bp check. She has tolerated the medication without any problems. She reports compliance with meds. She denies headaches, chest pain and shortness of breath.    Diabetes She presents for her follow-up diabetic visit. She has type 2 diabetes mellitus. Her disease course has been improving. There are no hypoglycemic associated symptoms. Pertinent negatives for hypoglycemia include no headaches. Pertinent negatives for diabetes include no polydipsia, no polyphagia, no polyuria and no weakness. There are no hypoglycemic complications. Risk factors for coronary artery disease include diabetes mellitus, dyslipidemia, hypertension, obesity, sedentary lifestyle and post-menopausal. She is compliant with treatment most of the time. She is following a diabetic diet. She participates in exercise intermittently. Her breakfast blood glucose is taken between 8-9 am. Her breakfast blood glucose range is generally 110-130 mg/dl. An ACE inhibitor/angiotensin II receptor blocker is being taken. Eye exam is current.  Hypertension This is a chronic problem. The current episode started more than 1 year ago. The problem has been gradually improving since onset. The problem is controlled. Pertinent negatives include no headaches. Risk factors for coronary artery disease include diabetes mellitus, dyslipidemia, obesity, sedentary lifestyle and post-menopausal state. Compliance problems include exercise.      Past Medical History:  Diagnosis Date   Diabetes  mellitus ([place])    Diabetes mellitus without complication ([place])    History of chicken pox    History of measles    History of mumps    Hypertension    Tilted uterus    UTI (urinary tract infection)      Family History  Problem Relation Age of Onset   Hypertension Mother    Hypertension Father      Current Outpatient Medications:    amLODipine (NORVASC) 2.5 MG tablet, Take 1 tablet (2.5 mg total) by mouth daily., Disp: 30 tablet, Rfl: 11   aspirin 81 MG EC tablet, Take 81 mg by mouth daily. Swallow whole., Disp: , Rfl:    b complex vitamins tablet, Take 1 tablet by mouth daily. 3 times per week, Disp: , Rfl:    CRESTOR 20 MG tablet, TAKE 1 TABLET BY MOUTH EVERY DAY, Disp: 90 tablet, Rfl: 3   dapagliflozin propanediol (FARXIGA) 10 MG TABS tablet, Take 1 tablet (10 mg total) by mouth daily before breakfast., Disp: 90 tablet, Rfl: 2   diphenhydrAMINE (BENADRYL) 25 MG tablet, Take 1 tablet (25 mg total) by mouth every 6 (six) hours as needed for itching or allergies (Rash)., Disp: 30 tablet, Rfl: 0   dorzolamide-timolol (COSOPT) 22.3-6.8 MG/ML ophthalmic solution, SMARTSIG:In Eye(s), Disp: , Rfl:    EPINEPHrine 0.3 mg/0.3 mL IJ SOAJ injection, EpiPen 2-Pak 0.3 mg/0.3 mL injection, auto-injector, Disp: , Rfl:    fluticasone (FLONASE) 50 MCG/ACT nasal spray, SPRAY 2 SPRAYS INTO EACH NOSTRIL EVERY DAY, Disp: 48 mL, Rfl: 2   latanoprost (XALATAN) 0.005 % ophthalmic solution, , Disp: , Rfl:    levothyroxine (SYNTHROID) 100 MCG tablet, TAKE 1 TABLET BY MOUTH EVERY  DAY, Disp: 90 tablet, Rfl: 1   Semaglutide, 1 MG/DOSE, (OZEMPIC, 1 MG/DOSE,) 4 MG/3ML SOPN, INJECT 1 MG INTO THE SKIN ONE TIME PER WEEK, Disp: 9 mL, Rfl: 8   Tdap (BOOSTRIX) 5-2.5-18.5 LF-MCG/0.5 injection, Inject 0.5 mLs into the muscle once for 1 dose., Disp: 0.5 mL, Rfl: 0   valsartan-hydrochlorothiazide (DIOVAN-HCT) 160-12.5 MG tablet, TAKE 1 TABLET BY MOUTH EVERY DAY, Disp: 90 tablet, Rfl: 2   Allergies  Allergen Reactions    Shellfish Allergy Anaphylaxis   Shellfish Allergy      Review of Systems  Constitutional: Negative.   Eyes: Negative.   Endocrine: Negative for polydipsia, polyphagia and polyuria.  Musculoskeletal: Negative.   Skin: Negative.   Neurological:  Negative for weakness and headaches.  Psychiatric/Behavioral: Negative.       Today's Vitals   09/01/22 0938 09/01/22 1004  BP: (!) 142/80 (!) 142/84  Pulse: (!) 107   Temp: 98.5 F (36.9 C)   Weight: 169 lb 6.4 oz (76.8 kg)   Height: 5' (1.524 m)   PainSc: 0-No pain    Body mass index is 33.08 kg/m.  Wt Readings from Last 3 Encounters:  09/01/22 169 lb 6.4 oz (76.8 kg)  06/24/22 170 lb (77.1 kg)  05/14/22 172 lb 3.2 oz (78.1 kg)    BP Readings from Last 3 Encounters:  09/01/22 (!) 142/84  06/24/22 (!) 141/83  05/14/22 122/74    Objective:  Physical Exam Vitals and nursing note reviewed.  Constitutional:      Appearance: Normal appearance.  HENT:     Head: Normocephalic and atraumatic.     Nose:     Comments: Masked     Mouth/Throat:     Comments: Masked  Eyes:     Extraocular Movements: Extraocular movements intact.  Cardiovascular:     Rate and Rhythm: Normal rate and regular rhythm.     Heart sounds: Normal heart sounds.  Pulmonary:     Effort: Pulmonary effort is normal.     Breath sounds: Normal breath sounds.  Musculoskeletal:     Cervical back: Normal range of motion.  Skin:    General: Skin is warm.  Neurological:     General: No focal deficit present.     Mental Status: She is alert.  Psychiatric:        Mood and Affect: Mood normal.        Behavior: Behavior normal.     Assessment And Plan:     1. Uncontrolled type 2 diabetes mellitus with hyperglycemia (HCC) Comments: Chronic, she has had two rounds of prednisone the past two months so I do expect an elevation in her a1c. No med changes at this time. F/u 3 months. - Hemoglobin A1c - Amb Referral To Provider Referral Exercise Program  (P.R.E.P)  2. Essential hypertension, benign Comments: Uncontrolled. I will add amlodipine 2.63m nightly. She agrees to rto in 2 weeks for a nurse vsiit. - CMP14+EGFR - CBC no Diff - amLODipine (NORVASC) 2.5 MG tablet; Take 1 tablet (2.5 mg total) by mouth daily.  Dispense: 30 tablet; Refill: 11 - Amb Referral To Provider Referral Exercise Program (P.R.E.P)  3. Class 1 obesity due to excess calories with serious comorbidity and body mass index (BMI) of 33.0 to 33.9 in adult Comments: She is encouraged to increase her exercise to 150 minutes/week. - Amb Referral To Provider Referral Exercise Program (P.R.E.P)  4. Immunization due - Flu Vaccine QUAD High Dose(Fluad) - Tdap (BOOSTRIX) 5-2.5-18.5 LF-MCG/0.5 injection; Inject 0.5 mLs  into the muscle once for 1 dose.  Dispense: 0.5 mL; Refill: 0   Patient was given opportunity to ask questions. Patient verbalized understanding of the plan and was able to repeat key elements of the plan. All questions were answered to their satisfaction.   I, [name], MD, have reviewed all documentation for this visit. The documentation on 09/01/22 for the exam, diagnosis, procedures, and orders are all accurate and complete.   IF YOU HAVE BEEN REFERRED TO A SPECIALIST, IT MAY TAKE 1-2 WEEKS TO SCHEDULE/PROCESS THE REFERRAL. IF YOU HAVE NOT HEARD FROM US/SPECIALIST IN TWO WEEKS, PLEASE GIVE [place] A CALL AT [phone].   THE PATIENT IS ENCOURAGED TO PRACTICE SOCIAL DISTANCING DUE TO THE COVID-19 PANDEMIC.

## 2022-09-02 LAB — CMP14+EGFR
ALT: 18 IU/L (ref 0–32)
AST: 17 IU/L (ref 0–40)
Albumin/Globulin Ratio: 1.7 (ref 1.2–2.2)
Albumin: 4.5 g/dL (ref 3.9–4.9)
Alkaline Phosphatase: 69 IU/L (ref 44–121)
BUN/Creatinine Ratio: 22 (ref 12–28)
BUN: 16 mg/dL (ref 8–27)
Bilirubin Total: 0.3 mg/dL (ref 0.0–1.2)
CO2: 20 mmol/L (ref 20–29)
Calcium: 9.9 mg/dL (ref 8.7–10.3)
Chloride: 103 mmol/L (ref 96–106)
Creatinine, Ser: 0.72 mg/dL (ref 0.57–1.00)
Globulin, Total: 2.6 g/dL (ref 1.5–4.5)
Glucose: 110 mg/dL — ABNORMAL HIGH (ref 70–99)
Potassium: 4.2 mmol/L (ref 3.5–5.2)
Sodium: 143 mmol/L (ref 134–144)
Total Protein: 7.1 g/dL (ref 6.0–8.5)
eGFR: 90 mL/min/{1.73_m2} (ref >59)

## 2022-09-02 LAB — CBC
Hematocrit: 36.3 % (ref 34.0–46.6)
Hemoglobin: 11.8 g/dL (ref 11.1–15.9)
MCH: 26.5 pg — ABNORMAL LOW (ref 26.6–33.0)
MCHC: 32.5 g/dL (ref 31.5–35.7)
MCV: 81 fL (ref 79–97)
Platelets: 223 10*3/uL (ref 150–450)
RBC: 4.46 x10E6/uL (ref 3.77–5.28)
RDW: 14 % (ref 11.7–15.4)
WBC: 5.5 10*3/uL (ref 3.4–10.8)

## 2022-09-02 LAB — HEMOGLOBIN A1C
Est. average glucose Bld gHb Est-mCnc: 157 mg/dL
Hgb A1c MFr Bld: 7.1 % — ABNORMAL HIGH (ref 4.8–5.6)

## 2022-09-04 ENCOUNTER — Other Ambulatory Visit: Payer: Self-pay | Admitting: Internal Medicine

## 2022-09-04 ENCOUNTER — Telehealth: Payer: Self-pay

## 2022-09-04 DIAGNOSIS — Z1231 Encounter for screening mammogram for malignant neoplasm of breast: Secondary | ICD-10-CM

## 2022-09-04 NOTE — Telephone Encounter (Signed)
Called to discuss PREP program referral; left voicemail. 

## 2022-09-16 ENCOUNTER — Ambulatory Visit: Payer: Medicare Other

## 2022-09-16 VITALS — BP 128/62 | HR 99 | Temp 98.3°F

## 2022-09-16 DIAGNOSIS — I10 Essential (primary) hypertension: Secondary | ICD-10-CM

## 2022-09-16 NOTE — Progress Notes (Signed)
Patient presents today for BPC. She is currently taking Amlodipine 2.5 and valsartan/hctz 160-12.5. the amolodipine was added last visit.   BP Readings from Last 3 Encounters:  09/16/22 128/62  09/01/22 (!) 142/84  06/24/22 (!) 141/83   Patient will continue with current medication and follow up with provider as scheduled.

## 2022-09-16 NOTE — Patient Instructions (Signed)
Hypertension, Adult High blood pressure (hypertension) is when the force of blood pumping through the arteries is too strong. The arteries are the blood vessels that carry blood from the heart throughout the body. Hypertension forces the heart to work harder to pump blood and may cause arteries to become narrow or stiff. Untreated or uncontrolled hypertension can lead to a heart attack, heart failure, a stroke, kidney disease, and other problems. A blood pressure reading consists of a higher number over a lower number. Ideally, your blood pressure should be below 120/80. The first ("top") number is called the systolic pressure. It is a measure of the pressure in your arteries as your heart beats. The second ("bottom") number is called the diastolic pressure. It is a measure of the pressure in your arteries as the heart relaxes. What are the causes? The exact cause of this condition is not known. There are some conditions that result in high blood pressure. What increases the risk? Certain factors may make you more likely to develop high blood pressure. Some of these risk factors are under your control, including: Smoking. Not getting enough exercise or physical activity. Being overweight. Having too much fat, sugar, calories, or salt (sodium) in your diet. Drinking too much alcohol. Other risk factors include: Having a personal history of heart disease, diabetes, high cholesterol, or kidney disease. Stress. Having a family history of high blood pressure and high cholesterol. Having obstructive sleep apnea. Age. The risk increases with age. What are the signs or symptoms? High blood pressure may not cause symptoms. Very high blood pressure (hypertensive crisis) may cause: Headache. Fast or irregular heartbeats (palpitations). Shortness of breath. Nosebleed. Nausea and vomiting. Vision changes. Severe chest pain, dizziness, and seizures. How is this diagnosed? This condition is diagnosed by  measuring your blood pressure while you are seated, with your arm resting on a flat surface, your legs uncrossed, and your feet flat on the floor. The cuff of the blood pressure monitor will be placed directly against the skin of your upper arm at the level of your heart. Blood pressure should be measured at least twice using the same arm. Certain conditions can cause a difference in blood pressure between your right and left arms. If you have a high blood pressure reading during one visit or you have normal blood pressure with other risk factors, you may be asked to: Return on a different day to have your blood pressure checked again. Monitor your blood pressure at home for 1 week or longer. If you are diagnosed with hypertension, you may have other blood or imaging tests to help your health care provider understand your overall risk for other conditions. How is this treated? This condition is treated by making healthy lifestyle changes, such as eating healthy foods, exercising more, and reducing your alcohol intake. You may be referred for counseling on a healthy diet and physical activity. Your health care provider may prescribe medicine if lifestyle changes are not enough to get your blood pressure under control and if: Your systolic blood pressure is above 130. Your diastolic blood pressure is above 80. Your personal target blood pressure may vary depending on your medical conditions, your age, and other factors. Follow these instructions at home: Eating and drinking  Eat a diet that is high in fiber and potassium, and low in sodium, added sugar, and fat. An example of this eating plan is called the DASH diet. DASH stands for Dietary Approaches to Stop Hypertension. To eat this way: Eat   plenty of fresh fruits and vegetables. Try to fill one half of your plate at each meal with fruits and vegetables. Eat whole grains, such as whole-wheat pasta, brown rice, or whole-grain bread. Fill about one  fourth of your plate with whole grains. Eat or drink low-fat dairy products, such as skim milk or low-fat yogurt. Avoid fatty cuts of meat, processed or cured meats, and poultry with skin. Fill about one fourth of your plate with lean proteins, such as fish, chicken without skin, beans, eggs, or tofu. Avoid pre-made and processed foods. These tend to be higher in sodium, added sugar, and fat. Reduce your daily sodium intake. Many people with hypertension should eat less than 1,500 mg of sodium a day. Do not drink alcohol if: Your health care provider tells you not to drink. You are pregnant, may be pregnant, or are planning to become pregnant. If you drink alcohol: Limit how much you have to: 0-1 drink a day for women. 0-2 drinks a day for men. Know how much alcohol is in your drink. In the [place] one drink equals one 12 oz bottle of beer (355 mL), one 5 oz glass of wine (148 mL), or one 1 oz glass of hard liquor (44 mL). Lifestyle  Work with your health care provider to maintain a healthy body weight or to lose weight. Ask what an ideal weight is for you. Get at least 30 minutes of exercise that causes your heart to beat faster (aerobic exercise) most days of the week. Activities may include walking, swimming, or biking. Include exercise to strengthen your muscles (resistance exercise), such as Pilates or lifting weights, as part of your weekly exercise routine. Try to do these types of exercises for 30 minutes at least 3 days a week. Do not use any products that contain nicotine or tobacco. These products include cigarettes, chewing tobacco, and vaping devices, such as e-cigarettes. If you need help quitting, ask your health care provider. Monitor your blood pressure at home as told by your health care provider. Keep all follow-up visits. This is important. Medicines Take over-the-counter and prescription medicines only as told by your health care provider. Follow directions carefully. Blood  pressure medicines must be taken as prescribed. Do not skip doses of blood pressure medicine. Doing this puts you at risk for problems and can make the medicine less effective. Ask your health care provider about side effects or reactions to medicines that you should watch for. Contact a health care provider if you: Think you are having a reaction to a medicine you are taking. Have headaches that keep coming back (recurring). Feel dizzy. Have swelling in your ankles. Have trouble with your vision. Get help right away if you: Develop a severe headache or confusion. Have unusual weakness or numbness. Feel faint. Have severe pain in your chest or abdomen. Vomit repeatedly. Have trouble breathing. These symptoms may be an emergency. Get help right away. Call 911. Do not wait to see if the symptoms will go away. Do not drive yourself to the hospital. Summary Hypertension is when the force of blood pumping through your arteries is too strong. If this condition is not controlled, it may put you at risk for serious complications. Your personal target blood pressure may vary depending on your medical conditions, your age, and other factors. For most people, a normal blood pressure is less than 120/80. Hypertension is treated with lifestyle changes, medicines, or a combination of both. Lifestyle changes include losing weight, eating a healthy,   low-sodium diet, exercising more, and limiting alcohol. This information is not intended to replace advice given to you by your health care provider. Make sure you discuss any questions you have with your health care provider. Document Revised: 07/09/2021 Document Reviewed: 07/09/2021 Elsevier Patient Education  2023 [employer]  

## 2022-09-23 NOTE — Telephone Encounter (Signed)
[facility]-error.  

## 2022-10-29 ENCOUNTER — Ambulatory Visit: Payer: Medicare Other

## 2022-11-15 ENCOUNTER — Other Ambulatory Visit: Payer: Self-pay | Admitting: Internal Medicine

## 2022-11-27 ENCOUNTER — Ambulatory Visit: Payer: Medicare Other

## 2022-12-10 ENCOUNTER — Ambulatory Visit: Payer: Self-pay | Admitting: Internal Medicine

## 2022-12-17 ENCOUNTER — Ambulatory Visit: Payer: Medicare Other | Admitting: Internal Medicine

## 2023-01-07 ENCOUNTER — Ambulatory Visit: Payer: Medicare Other | Admitting: Internal Medicine

## 2023-01-09 ENCOUNTER — Ambulatory Visit
Admission: RE | Admit: 2023-01-09 | Discharge: 2023-01-09 | Disposition: A | Discharge status: Home / Self Care | Payer: Medicare Other | Source: Ambulatory Visit | Attending: Internal Medicine | Admitting: Internal Medicine

## 2023-01-09 DIAGNOSIS — Z1231 Encounter for screening mammogram for malignant neoplasm of breast: Secondary | ICD-10-CM

## 2023-01-14 ENCOUNTER — Other Ambulatory Visit: Payer: Self-pay | Admitting: Internal Medicine

## 2023-01-14 DIAGNOSIS — R928 Other abnormal and inconclusive findings on diagnostic imaging of breast: Secondary | ICD-10-CM

## 2023-01-18 ENCOUNTER — Other Ambulatory Visit: Payer: Self-pay | Admitting: Internal Medicine

## 2023-01-23 ENCOUNTER — Ambulatory Visit
Admission: RE | Admit: 2023-01-23 | Discharge: 2023-01-23 | Disposition: A | Discharge status: Home / Self Care | Payer: Medicare Other | Source: Ambulatory Visit | Attending: Internal Medicine | Admitting: Internal Medicine

## 2023-01-23 DIAGNOSIS — R928 Other abnormal and inconclusive findings on diagnostic imaging of breast: Secondary | ICD-10-CM

## 2023-01-26 ENCOUNTER — Other Ambulatory Visit: Payer: Self-pay | Admitting: Internal Medicine

## 2023-01-26 DIAGNOSIS — R921 Mammographic calcification found on diagnostic imaging of breast: Secondary | ICD-10-CM

## 2023-02-03 ENCOUNTER — Other Ambulatory Visit: Payer: Medicare Other

## 2023-02-03 ENCOUNTER — Encounter: Payer: Self-pay | Admitting: Internal Medicine

## 2023-02-03 ENCOUNTER — Ambulatory Visit (INDEPENDENT_AMBULATORY_CARE_PROVIDER_SITE_OTHER): Payer: Medicare Other | Admitting: Internal Medicine

## 2023-02-03 VITALS — BP 120/80 | HR 88 | Temp 98.5°F | Ht 60.0 in | Wt 169.0 lb

## 2023-02-03 DIAGNOSIS — Z6833 Body mass index (BMI) 33.0-33.9, adult: Secondary | ICD-10-CM | POA: Diagnosis not present

## 2023-02-03 DIAGNOSIS — E6609 Other obesity due to excess calories: Secondary | ICD-10-CM

## 2023-02-03 DIAGNOSIS — E1165 Type 2 diabetes mellitus with hyperglycemia: Secondary | ICD-10-CM

## 2023-02-03 DIAGNOSIS — I10 Essential (primary) hypertension: Secondary | ICD-10-CM | POA: Diagnosis not present

## 2023-02-03 MED ORDER — CRESTOR 20 MG PO TABS: 20.0000 mg | ORAL_TABLET | Freq: Every day | ORAL | 3 refills | Status: DC

## 2023-02-03 NOTE — Progress Notes (Signed)
[name],acting as a Neurosurgeon for Tanya Aliment, MD.,have documented all relevant documentation on the behalf of Tanya Aliment, MD,as directed by  Tanya Aliment, MD while in the presence of Tanya Aliment, MD.    Subjective:     Patient ID: Tanya Levy , female    DOB: 02-08-1951 , 72 y.o.   MRN: [id number]   Chief Complaint  Patient presents with   Diabetes   Hypertension    HPI  Patient presents today for DM and BP check.  She reports compliance with medications and has no other concerns today. She admits she is not yet exercising as much as she should.   Wt Readings from Last 3 Encounters: 02/03/23 : 169 lb (76.7 kg) 09/01/22 : 169 lb 6.4 oz (76.8 kg) 06/24/22 : 170 lb (77.1 kg)    Diabetes She presents for her follow-up diabetic visit. She has type 2 diabetes mellitus. Her disease course has been improving. There are no hypoglycemic associated symptoms. Pertinent negatives for hypoglycemia include no headaches. Pertinent negatives for diabetes include no chest pain, no fatigue, no polydipsia, no polyphagia, no polyuria and no weakness. There are no hypoglycemic complications. Risk factors for coronary artery disease include diabetes mellitus, dyslipidemia, hypertension, obesity, sedentary lifestyle and post-menopausal. She is compliant with treatment most of the time. She is following a diabetic diet. She participates in exercise intermittently. Her breakfast blood glucose is taken between 8-9 am. Her breakfast blood glucose range is generally 110-130 mg/dl. An ACE inhibitor/angiotensin II receptor blocker is being taken. Eye exam is current.  Hypertension This is a chronic problem. The current episode started more than 1 year ago. The problem has been gradually improving since onset. The problem is controlled. Pertinent negatives include no chest pain or headaches. Risk factors for coronary artery disease include diabetes mellitus, dyslipidemia, obesity,  sedentary lifestyle and post-menopausal state. Compliance problems include exercise.      Past Medical History:  Diagnosis Date   Diabetes mellitus (HCC)    Diabetes mellitus without complication (HCC)    History of chicken pox    History of measles    History of mumps    Hypertension    Tilted uterus    UTI (urinary tract infection)      Family History  Problem Relation Age of Onset   Hypertension Mother    Hypertension Father      Current Outpatient Medications:    amLODipine (NORVASC) 2.5 MG tablet, Take 1 tablet (2.5 mg total) by mouth daily., Disp: 30 tablet, Rfl: 11   aspirin 81 MG EC tablet, Take 81 mg by mouth daily. Swallow whole., Disp: , Rfl:    b complex vitamins tablet, Take 1 tablet by mouth daily. 3 times per week, Disp: , Rfl:    diphenhydrAMINE (BENADRYL) 25 MG tablet, Take 1 tablet (25 mg total) by mouth every 6 (six) hours as needed for itching or allergies (Rash)., Disp: 30 tablet, Rfl: 0   dorzolamide-timolol (COSOPT) 22.3-6.8 MG/ML ophthalmic solution, SMARTSIG:In Eye(s), Disp: , Rfl:    EPINEPHrine 0.3 mg/0.3 mL IJ SOAJ injection, EpiPen 2-Pak 0.3 mg/0.3 mL injection, auto-injector, Disp: , Rfl:    FARXIGA 10 MG TABS tablet, TAKE 1 TABLET BY MOUTH DAILY BEFORE BREAKFAST., Disp: 90 tablet, Rfl: 2   fluticasone (FLONASE) 50 MCG/ACT nasal spray, SPRAY 2 SPRAYS INTO EACH NOSTRIL EVERY DAY, Disp: 48 mL, Rfl: 2   latanoprost (XALATAN) 0.005 % ophthalmic solution, , Disp: , Rfl:  levothyroxine (SYNTHROID) 100 MCG tablet, TAKE 1 TABLET BY MOUTH EVERY DAY, Disp: 90 tablet, Rfl: 1   Semaglutide, 1 MG/DOSE, (OZEMPIC, 1 MG/DOSE,) 4 MG/3ML SOPN, INJECT 1 MG INTO THE SKIN ONE TIME PER WEEK, Disp: 9 mL, Rfl: 8   valsartan-hydrochlorothiazide (DIOVAN-HCT) 160-12.5 MG tablet, TAKE 1 TABLET BY MOUTH EVERY DAY, Disp: 90 tablet, Rfl: 2   CRESTOR 20 MG tablet, Take 1 tablet (20 mg total) by mouth daily., Disp: 90 tablet, Rfl: 3   Allergies  Allergen Reactions   Shellfish  Allergy Anaphylaxis   Shellfish Allergy      Review of Systems  Constitutional: Negative.  Negative for fatigue.  HENT: Negative.    Eyes: Negative.   Respiratory: Negative.    Cardiovascular: Negative.  Negative for chest pain.  Gastrointestinal: Negative.   Endocrine: Negative for polydipsia, polyphagia and polyuria.  Musculoskeletal: Negative.   Skin: Negative.   Neurological:  Negative for weakness and headaches.  Psychiatric/Behavioral: Negative.       Today's Vitals   02/03/23 1449  BP: 120/80  Pulse: 88  Temp: 98.5 F (36.9 C)  Weight: 169 lb (76.7 kg)  Height: 5' (1.524 m)  PainSc: 0-No pain   Body mass index is 33.01 kg/m.  Wt Readings from Last 3 Encounters:  02/03/23 169 lb (76.7 kg)  09/01/22 169 lb 6.4 oz (76.8 kg)  06/24/22 170 lb (77.1 kg)    The 10-year ASCVD risk score (Arnett DK, et al., 2019) is: 17.8%   Values used to calculate the score:     Age: 33 years     Sex: Female     Is Non-Hispanic African American: Yes     Diabetic: Yes     Tobacco smoker: No     Systolic Blood Pressure: 120 mmHg     Is BP treated: Yes     HDL Cholesterol: 44 mg/dL     Total Cholesterol: 146 mg/dL ++ Objective:  Physical Exam Vitals and nursing note reviewed.  Constitutional:      Appearance: Normal appearance.  HENT:     Head: Normocephalic and atraumatic.  Eyes:     Extraocular Movements: Extraocular movements intact.  Cardiovascular:     Rate and Rhythm: Normal rate and regular rhythm.     Heart sounds: Normal heart sounds.  Pulmonary:     Effort: Pulmonary effort is normal.     Breath sounds: Normal breath sounds.  Musculoskeletal:     Cervical back: Normal range of motion.  Skin:    General: Skin is warm.  Neurological:     General: No focal deficit present.     Mental Status: She is alert.  Psychiatric:        Mood and Affect: Mood normal.        Behavior: Behavior normal.      Assessment And Plan:     1. Uncontrolled type 2 diabetes  mellitus with hyperglycemia (HCC) Comments: Chronic, I will check labs as below. She will c/w semaglutide 1mg  weekly and Farxiga 10mg  daily.  She will f/u in 3-4 months. - CMP14+EGFR - Hemoglobin A1c - Lipid panel  2. Essential hypertension, benign Comments: Chronic, well controlled. She will c/w amlodipine 2.5mg  daily and valsartan/hct 160/12.5mg  daily.  She is encouraged to follow heart healthy diet. - CMP14+EGFR - Lipid panel  3. Class 1 obesity due to excess calories with serious comorbidity and body mass index (BMI) of 33.0 to 33.9 in adult  She is encouraged to strive for BMI less  than 30 to decrease cardiac risk. Advised to aim for at least 150 minutes of exercise per week.   Return in 4 months (on 06/06/2023), or physical.  Patient was given opportunity to ask questions. Patient verbalized understanding of the plan and was able to repeat key elements of the plan. All questions were answered to their satisfaction.   I, Tanya Aliment, MD, have reviewed all documentation for this visit. The documentation on 02/03/23 for the exam, diagnosis, procedures, and orders are all accurate and complete.   IF YOU HAVE BEEN REFERRED TO A SPECIALIST, IT MAY TAKE 1-2 WEEKS TO SCHEDULE/PROCESS THE REFERRAL. IF YOU HAVE NOT HEARD FROM US/SPECIALIST IN TWO WEEKS, PLEASE GIVE [place] A CALL AT [phone].   THE PATIENT IS ENCOURAGED TO PRACTICE SOCIAL DISTANCING DUE TO THE COVID-19 PANDEMIC.

## 2023-02-03 NOTE — Patient Instructions (Signed)

## 2023-02-04 ENCOUNTER — Encounter: Payer: Self-pay | Admitting: Internal Medicine

## 2023-02-04 LAB — CMP14+EGFR
ALT: 21 IU/L (ref 0–32)
AST: 16 IU/L (ref 0–40)
Albumin/Globulin Ratio: 1.7 (ref 1.2–2.2)
Albumin: 4.5 g/dL (ref 3.9–4.9)
Alkaline Phosphatase: 85 IU/L (ref 44–121)
BUN/Creatinine Ratio: 19 (ref 12–28)
BUN: 17 mg/dL (ref 8–27)
Bilirubin Total: 0.4 mg/dL (ref 0.0–1.2)
CO2: 24 mmol/L (ref 20–29)
Calcium: 9.6 mg/dL (ref 8.7–10.3)
Chloride: 105 mmol/L (ref 96–106)
Creatinine, Ser: 0.88 mg/dL (ref 0.57–1.00)
Globulin, Total: 2.7 g/dL (ref 1.5–4.5)
Glucose: 99 mg/dL (ref 70–99)
Potassium: 4.6 mmol/L (ref 3.5–5.2)
Sodium: 144 mmol/L (ref 134–144)
Total Protein: 7.2 g/dL (ref 6.0–8.5)
eGFR: 71 mL/min/{1.73_m2} (ref >59)

## 2023-02-04 LAB — HEMOGLOBIN A1C
Est. average glucose Bld gHb Est-mCnc: 171 mg/dL
Hgb A1c MFr Bld: 7.6 % — ABNORMAL HIGH (ref 4.8–5.6)

## 2023-02-04 LAB — LIPID PANEL
Chol/HDL Ratio: 3.3 ratio (ref 0.0–4.4)
Cholesterol, Total: 146 mg/dL (ref 100–199)
HDL: 44 mg/dL (ref >39)
LDL Chol Calc (NIH): 76 mg/dL (ref 0–99)
Triglycerides: 149 mg/dL (ref 0–149)
VLDL Cholesterol Cal: 26 mg/dL (ref 5–40)

## 2023-03-01 ENCOUNTER — Other Ambulatory Visit: Payer: Self-pay | Admitting: Internal Medicine

## 2023-04-09 ENCOUNTER — Other Ambulatory Visit: Payer: Self-pay | Admitting: Internal Medicine

## 2023-05-06 ENCOUNTER — Ambulatory Visit (INDEPENDENT_AMBULATORY_CARE_PROVIDER_SITE_OTHER): Payer: Medicare Other

## 2023-05-06 DIAGNOSIS — Z0000 Encounter for general adult medical examination without abnormal findings: Secondary | ICD-10-CM | POA: Diagnosis not present

## 2023-05-06 NOTE — Progress Notes (Signed)
Subjective:   Tanya Levy is a 72 y.o. female who presents for Medicare Annual (Subsequent) preventive examination.  Visit Complete: Virtual  I connected with  Anaston D Johnson-Squire on 05/06/23 by a audio enabled telemedicine application and verified that I am speaking with the correct person using two identifiers.  Patient Location: Home  Provider Location: Office/Clinic  I discussed the limitations of evaluation and management by telemedicine. The patient expressed understanding and agreed to proceed.  Vital Signs: Unable to obtain new vitals due to this being a telehealth visit.  Review of Systems     Cardiac Risk Factors include: advanced age (>26men, >52 women);diabetes mellitus;hypertension     Objective:    Today's Vitals   There is no height or weight on file to calculate BMI.     05/06/2023   12:05 PM 06/24/2022   10:00 AM 05/01/2022    9:39 AM 03/28/2021    9:16 AM 03/26/2020   12:44 PM 01/01/2016    9:19 PM  Advanced Directives  Does Patient Have a Medical Advance Directive? No No No No No No  Would patient like information on creating a medical advance directive?     Yes (MAU/Ambulatory/Procedural Areas - Information given)     Current Medications (verified) Outpatient Encounter Medications as of 05/06/2023  Medication Sig   amLODipine (NORVASC) 2.5 MG tablet Take 1 tablet (2.5 mg total) by mouth daily.   aspirin 81 MG EC tablet Take 81 mg by mouth daily. Swallow whole.   b complex vitamins tablet Take 1 tablet by mouth daily. 3 times per week   CRESTOR 20 MG tablet Take 1 tablet (20 mg total) by mouth daily.   diphenhydrAMINE (BENADRYL) 25 MG tablet Take 1 tablet (25 mg total) by mouth every 6 (six) hours as needed for itching or allergies (Rash).   dorzolamide-timolol (COSOPT) 22.3-6.8 MG/ML ophthalmic solution SMARTSIG:In Eye(s)   EPINEPHrine 0.3 mg/0.3 mL IJ SOAJ injection EpiPen 2-Pak 0.3 mg/0.3 mL injection, auto-injector   FARXIGA 10 MG  TABS tablet TAKE 1 TABLET BY MOUTH DAILY BEFORE BREAKFAST.   fluticasone (FLONASE) 50 MCG/ACT nasal spray SPRAY 2 SPRAYS INTO EACH NOSTRIL EVERY DAY   latanoprost (XALATAN) 0.005 % ophthalmic solution    levothyroxine (SYNTHROID) 100 MCG tablet TAKE 1 TABLET BY MOUTH EVERY DAY   Semaglutide, 1 MG/DOSE, (OZEMPIC, 1 MG/DOSE,) 4 MG/3ML SOPN INJECT 1 MG INTO THE SKIN ONE TIME PER WEEK   valsartan-hydrochlorothiazide (DIOVAN-HCT) 160-12.5 MG tablet TAKE 1 TABLET BY MOUTH EVERY DAY   No facility-administered encounter medications on file as of 05/06/2023.    Allergies (verified) Shellfish allergy and Shellfish allergy   History: Past Medical History:  Diagnosis Date   Diabetes mellitus (HCC)    Diabetes mellitus without complication (HCC)    History of chicken pox    History of measles    History of mumps    Hypertension    Tilted uterus    UTI (urinary tract infection)    Past Surgical History:  Procedure Laterality Date   EYE SURGERY     KNEE ARTHROSCOPY     KNEE SURGERY     Family History  Problem Relation Age of Onset   Hypertension Mother    Hypertension Father    Social History   Socioeconomic History   Marital status: Married    Spouse name: Not on file   Number of children: Not on file   Years of education: Not on file   Highest education level: Not  on file  Occupational History   Not on file  Tobacco Use   Smoking status: Never   Smokeless tobacco: Never  Vaping Use   Vaping status: Never Used  Substance and Sexual Activity   Alcohol use: Yes    Comment: occasional   Drug use: No   Sexual activity: Yes    Birth control/protection: Post-menopausal  Other Topics Concern   Not on file  Social History Narrative   ** Merged History Encounter **       Social Determinants of Health   Financial Resource Strain: Low Risk  (05/06/2023)   Overall Financial Resource Strain (CARDIA)    Difficulty of Paying Living Expenses: Not hard at all  Food Insecurity: No  Food Insecurity (05/06/2023)   Hunger Vital Sign    Worried About Running Out of Food in the Last Year: Never true    Ran Out of Food in the Last Year: Never true  Transportation Needs: No Transportation Needs (05/06/2023)   PRAPARE - Administrator, Civil Service (Medical): No    Lack of Transportation (Non-Medical): No  Physical Activity: Sufficiently Active (05/06/2023)   Exercise Vital Sign    Days of Exercise per Week: 4 days    Minutes of Exercise per Session: 40 min  Stress: No Stress Concern Present (05/06/2023)   [employer] of Occupational Health - Occupational Stress Questionnaire    Feeling of Stress : Not at all  Social Connections: Socially Integrated (05/06/2023)   Social Connection and Isolation Panel [NHANES]    Frequency of Communication with Friends and Family: More than three times a week    Frequency of Social Gatherings with Friends and Family: More than three times a week    Attends Religious Services: More than 4 times per year    Active Member of [employer] or Organizations: Yes    Attends Engineer, structural: More than 4 times per year    Marital Status: Married    Tobacco Counseling Counseling given: Not Answered   Clinical Intake:  Pre-visit preparation completed: Yes  Pain : No/denies pain     Nutritional Risks: None Diabetes: Yes CBG done?: No Did pt. bring in CBG monitor from home?: No  How often do you need to have someone help you when you read instructions, pamphlets, or other written materials from your doctor or pharmacy?: 1 - Never  Interpreter Needed?: No  Information entered by :: NAllen LPN   Activities of Daily Living    05/06/2023   12:00 PM  In your present state of health, do you have any difficulty performing the following activities:  Hearing? 0  Vision? 0  Difficulty concentrating or making decisions? 0  Walking or climbing stairs? 1  Comment has to hold on to rail  Dressing or bathing? 0  Doing  errands, shopping? 0  Preparing Food and eating ? N  Using the Toilet? N  In the past six months, have you accidently leaked urine? N  Do you have problems with loss of bowel control? N  Managing your Medications? N  Managing your Finances? N  Housekeeping or managing your Housekeeping? N    Patient Care Team: Dorothyann Peng, MD as PCP - General (Internal Medicine) Dorothyann Peng, MD (Internal Medicine) Janet Berlin, MD as Consulting Physician (Ophthalmology)  Indicate any recent Medical Services you may have received from other than [facility] providers in the past year (date may be approximate).     Assessment:   This is  a routine wellness examination for Shandiin.  Hearing/Vision screen Hearing Screening - Comments:: Denies hearing issues Vision Screening - Comments:: Regular eye exams, Dr. Burgess Estelle  Dietary issues and exercise activities discussed:     Goals Addressed             This Visit's Progress    Patient Stated       05/06/2023, wants to be more healthy       Depression Screen    05/06/2023   12:07 PM 02/03/2023    2:52 PM 05/14/2022   11:52 AM 05/01/2022    9:39 AM 03/28/2021    9:16 AM 10/31/2020    8:49 AM 08/17/2019   12:06 PM  PHQ 2/9 Scores  PHQ - 2 Score 0 0 0 0 0 0 0  PHQ- 9 Score 0 0         Fall Risk    05/06/2023   12:07 PM 02/03/2023    2:52 PM 05/14/2022   11:50 AM 05/01/2022    9:39 AM 03/28/2021    9:16 AM  Fall Risk   Falls in the past year? 0 0 1 0 0  Number falls in past yr: 0 0 0 0   Injury with Fall? 0 0 0 0   Risk for fall due to : Medication side effect No Fall Risks History of fall(s) Medication side effect Medication side effect  Follow up Falls prevention discussed;Falls evaluation completed Falls evaluation completed Falls evaluation completed Falls evaluation completed;Education provided;Falls prevention discussed Falls evaluation completed;Education provided;Falls prevention discussed    MEDICARE RISK AT HOME: Medicare Risk at  Home Any stairs in or around the home?: No If so, are there any without handrails?: No Home free of loose throw rugs in walkways, pet beds, electrical cords, etc?: Yes Adequate lighting in your home to reduce risk of falls?: Yes Life alert?: No Use of a cane, walker or w/c?: No Grab bars in the bathroom?: Yes Shower chair or bench in shower?: No Elevated toilet seat or a handicapped toilet?: Yes  TIMED UP AND GO:  Was the test performed?  No    Cognitive Function:        05/06/2023   12:08 PM 05/01/2022    9:41 AM 03/28/2021    9:17 AM 03/26/2020   12:00 PM  6CIT Screen  What Year? 0 points 0 points 0 points 0 points  What month? 0 points 0 points 0 points 0 points  What time? 0 points 0 points 0 points 0 points  Count back from 20 0 points 0 points 0 points 2 points  Months in reverse 0 points 0 points 0 points 0 points  Repeat phrase 0 points 0 points 0 points 0 points  Total Score 0 points 0 points 0 points 2 points    Immunizations Immunization History  Administered Date(s) Administered   DTaP 04/19/2012   Fluad Quad(high Dose 65+) 07/31/2021, 09/01/2022   Influenza Inj Mdck Quad Pf 06/21/2019   Influenza Nasal 06/24/2018   Influenza, High Dose Seasonal PF 07/17/2020   Influenza,inj,Quad PF,6+ Mos 07/02/2018   Influenza-Unspecified 07/02/2018, 06/21/2019   PFIZER Comirnaty(Gray Top)Covid-19 Tri-Sucrose Vaccine 12/25/2020   PFIZER(Purple Top)SARS-COV-2 Vaccination 10/04/2019, 10/25/2019, 05/22/2020   PNEUMOCOCCAL CONJUGATE-20 11/12/2020   Pfizer Covid-19 Vaccine Bivalent Booster 75yrs & up 07/01/2021   Pneumococcal Conjugate-13 07/05/2019   Pneumococcal-Unspecified 11/12/2020   Tdap 02/03/2023   Zoster Recombinant(Shingrix) 08/03/2020, 04/26/2021    TDAP status: Up to date  Flu Vaccine status: Due, Education has  been provided regarding the importance of this vaccine. Advised may receive this vaccine at local pharmacy or Health Dept. Aware to provide a copy of  the vaccination record if obtained from local pharmacy or Health Dept. Verbalized acceptance and understanding.  Pneumococcal vaccine status: Up to date  Covid-19 vaccine status: Information provided on how to obtain vaccines.   Qualifies for Shingles Vaccine? Yes   Zostavax completed Yes   Shingrix Completed?: Yes  Screening Tests Health Maintenance  Topic Date Due   COVID-19 Vaccine (6 - 2023-24 season) 05/16/2022   FOOT EXAM  06/27/2022   INFLUENZA VACCINE  04/16/2023   Diabetic kidney evaluation - Urine ACR  05/15/2023   OPHTHALMOLOGY EXAM  06/07/2023   HEMOGLOBIN A1C  08/06/2023   Diabetic kidney evaluation - eGFR measurement  02/03/2024   Medicare Annual Wellness (AWV)  05/05/2024   MAMMOGRAM  01/22/2025   Colonoscopy  08/02/2027   DTaP/Tdap/Td (3 - Td or Tdap) 02/02/2033   Pneumonia Vaccine 71+ Years old  Completed   DEXA SCAN  Completed   Hepatitis C Screening  Completed   Zoster Vaccines- Shingrix  Completed   HPV VACCINES  Aged Out    Health Maintenance  Health Maintenance Due  Topic Date Due   COVID-19 Vaccine (6 - 2023-24 season) 05/16/2022   FOOT EXAM  06/27/2022   INFLUENZA VACCINE  04/16/2023   Diabetic kidney evaluation - Urine ACR  05/15/2023    Colorectal cancer screening: Type of screening: Colonoscopy. Completed 08/01/2017. Repeat every 10 years  Mammogram status: Completed 01/09/2023. Repeat every year  Bone Density status: Completed 05/20/2018.   Lung Cancer Screening: (Low Dose CT Chest recommended if Age 91-80 years, 20 pack-year currently smoking OR have quit w/in 15years.) does not qualify.   Lung Cancer Screening Referral: no  Additional Screening:  Hepatitis C Screening: does qualify; Completed 09/01/2012  Vision Screening: Recommended annual ophthalmology exams for early detection of glaucoma and other disorders of the eye. Is the patient up to date with their annual eye exam?  Yes  Who is the provider or what is the name of the  office in which the patient attends annual eye exams? Dr. Burgess Estelle If pt is not established with a provider, would they like to be referred to a provider to establish care? No .   Dental Screening: Recommended annual dental exams for proper oral hygiene  Diabetic Foot Exam: Diabetic Foot Exam: Completed 06/27/2021  Community Resource Referral / Chronic Care Management: CRR required this visit?  No   CCM required this visit?  No     Plan:     I have personally reviewed and noted the following in the patient's chart:   Medical and social history Use of alcohol, tobacco or illicit drugs  Current medications and supplements including opioid prescriptions. Patient is not currently taking opioid prescriptions. Functional ability and status Nutritional status Physical activity Advanced directives List of other physicians Hospitalizations, surgeries, and ER visits in previous 12 months Vitals Screenings to include cognitive, depression, and falls Referrals and appointments  In addition, I have reviewed and discussed with patient certain preventive protocols, quality metrics, and best practice recommendations. A written personalized care plan for preventive services as well as general preventive health recommendations were provided to patient.     Barb Merino, LPN   [id number]   After Visit Summary: (Pick Up) Due to this being a telephonic visit, with patients personalized plan was offered to patient and patient has requested to Pick up  at office.  Nurse Notes: none

## 2023-05-06 NOTE — Patient Instructions (Signed)
Ms. Nameth , Thank you for taking time to come for your Medicare Wellness Visit. I appreciate your ongoing commitment to your health goals. Please review the following plan we discussed and let me know if I can assist you in the future.   Referrals/Orders/Follow-Ups/Clinician Recommendations: none  This is a list of the screening recommended for you and due dates:  Health Maintenance  Topic Date Due   COVID-19 Vaccine (6 - 2023-24 season) 05/16/2022   Complete foot exam   06/27/2022   Flu Shot  04/16/2023   Yearly kidney health urinalysis for diabetes  05/15/2023   Eye exam for diabetics  06/07/2023   Hemoglobin A1C  08/06/2023   Yearly kidney function blood test for diabetes  02/03/2024   Medicare Annual Wellness Visit  05/05/2024   Mammogram  01/22/2025   Colon Cancer Screening  08/02/2027   DTaP/Tdap/Td vaccine (3 - Td or Tdap) 02/02/2033   Pneumonia Vaccine  Completed   DEXA scan (bone density measurement)  Completed   Hepatitis C Screening  Completed   Zoster (Shingles) Vaccine  Completed   HPV Vaccine  Aged Out    Advanced directives: (ACP Link)Information on Advanced Care Planning can be found at [facility] Advance Health Care Directives Advance Health Care Directives ([email or web address]   Next Medicare Annual Wellness Visit scheduled for next year: No, office will schedule appointment  Insert Preventive Care attachment Insert FALL PREVENTION attachment if needed

## 2023-06-04 ENCOUNTER — Ambulatory Visit (INDEPENDENT_AMBULATORY_CARE_PROVIDER_SITE_OTHER): Payer: Medicare Other | Admitting: Internal Medicine

## 2023-06-04 ENCOUNTER — Encounter: Payer: Self-pay | Admitting: Internal Medicine

## 2023-06-04 ENCOUNTER — Other Ambulatory Visit: Payer: Self-pay

## 2023-06-04 VITALS — BP 130/74 | HR 84 | Temp 98.5°F | Ht 64.0 in | Wt 167.8 lb

## 2023-06-04 DIAGNOSIS — I10 Essential (primary) hypertension: Secondary | ICD-10-CM

## 2023-06-04 DIAGNOSIS — E039 Hypothyroidism, unspecified: Secondary | ICD-10-CM

## 2023-06-04 DIAGNOSIS — E2839 Other primary ovarian failure: Secondary | ICD-10-CM

## 2023-06-04 DIAGNOSIS — Z23 Encounter for immunization: Secondary | ICD-10-CM | POA: Diagnosis not present

## 2023-06-04 DIAGNOSIS — E1165 Type 2 diabetes mellitus with hyperglycemia: Secondary | ICD-10-CM

## 2023-06-04 DIAGNOSIS — Z7984 Long term (current) use of oral hypoglycemic drugs: Secondary | ICD-10-CM

## 2023-06-04 DIAGNOSIS — Z7985 Long-term (current) use of injectable non-insulin antidiabetic drugs: Secondary | ICD-10-CM

## 2023-06-04 MED ORDER — AMLODIPINE BESYLATE 2.5 MG PO TABS: 2.5000 mg | ORAL_TABLET | Freq: Every day | ORAL | 2 refills | Status: DC

## 2023-06-04 MED ORDER — VALSARTAN-HYDROCHLOROTHIAZIDE 160-12.5 MG PO TABS
1.0000 | ORAL_TABLET | Freq: Every day | ORAL | 2 refills | Status: DC
Start: 2023-06-04 — End: 2024-03-07

## 2023-06-04 MED ORDER — OZEMPIC (1 MG/DOSE) 4 MG/3ML ~~LOC~~ SOPN
PEN_INJECTOR | SUBCUTANEOUS | 5 refills | Status: DC
Start: 2023-06-04 — End: 2023-11-23

## 2023-06-04 NOTE — Patient Instructions (Signed)

## 2023-06-04 NOTE — Assessment & Plan Note (Signed)
Chronic, diabetic foot exam was performed.  She is currently taking Ozempic 1mg  weekly and Farxiga 10mg  daily. She will f/u in four months for re-evaluation.

## 2023-06-04 NOTE — Progress Notes (Signed)
I,Tanya Levy, CMA,acting as a Neurosurgeon for Tanya Aliment, MD.,have documented all relevant documentation on the behalf of Tanya Aliment, MD,as directed by  Tanya Aliment, MD while in the presence of Tanya Aliment, MD.  Subjective:  Patient ID: Tanya Levy , female    DOB: [birth year] , 72 y.o.   MRN: [id number]  Chief Complaint  Patient presents with   Diabetes   Hypertension   Hypothyroidism    HPI  Patient presents today for DM and BP check.  She reports compliance with medications and has no other concerns today. Denies headache, chest pain & SOB. She would like to get flu vaccine today.       Diabetes She presents for her follow-up diabetic visit. She has type 2 diabetes mellitus. Her disease course has been improving. There are no hypoglycemic associated symptoms. Pertinent negatives for hypoglycemia include no headaches. Pertinent negatives for diabetes include no chest pain, no fatigue, no polydipsia, no polyphagia, no polyuria and no weakness. There are no hypoglycemic complications. Risk factors for coronary artery disease include diabetes mellitus, dyslipidemia, hypertension, obesity, sedentary lifestyle and post-menopausal. She is compliant with treatment most of the time. She is following a diabetic diet. She participates in exercise intermittently. Her breakfast blood glucose is taken between 8-9 am. Her breakfast blood glucose range is generally 110-130 mg/dl. An ACE inhibitor/angiotensin II receptor blocker is being taken. Eye exam is current.  Hypertension This is a chronic problem. The current episode started more than 1 year ago. The problem has been gradually improving since onset. The problem is controlled. Pertinent negatives include no chest pain or headaches. Risk factors for coronary artery disease include diabetes mellitus, dyslipidemia, obesity, sedentary lifestyle and post-menopausal state. Compliance problems include exercise.      Past  Medical History:  Diagnosis Date   Diabetes mellitus (HCC)    Diabetes mellitus without complication (HCC)    History of chicken pox    History of measles    History of mumps    Hypertension    Tilted uterus    UTI (urinary tract infection)      Family History  Problem Relation Age of Onset   Hypertension Mother    Hypertension Father      Current Outpatient Medications:    aspirin 81 MG EC tablet, Take 81 mg by mouth daily. Swallow whole., Disp: , Rfl:    b complex vitamins tablet, Take 1 tablet by mouth daily. 3 times per week, Disp: , Rfl:    CRESTOR 20 MG tablet, Take 1 tablet (20 mg total) by mouth daily., Disp: 90 tablet, Rfl: 3   diphenhydrAMINE (BENADRYL) 25 MG tablet, Take 1 tablet (25 mg total) by mouth every 6 (six) hours as needed for itching or allergies (Rash)., Disp: 30 tablet, Rfl: 0   dorzolamide-timolol (COSOPT) 22.3-6.8 MG/ML ophthalmic solution, SMARTSIG:In Eye(s), Disp: , Rfl:    EPINEPHrine 0.3 mg/0.3 mL IJ SOAJ injection, EpiPen 2-Pak 0.3 mg/0.3 mL injection, auto-injector, Disp: , Rfl:    FARXIGA 10 MG TABS tablet, TAKE 1 TABLET BY MOUTH DAILY BEFORE BREAKFAST., Disp: 90 tablet, Rfl: 2   fluticasone (FLONASE) 50 MCG/ACT nasal spray, SPRAY 2 SPRAYS INTO EACH NOSTRIL EVERY DAY, Disp: 48 mL, Rfl: 2   latanoprost (XALATAN) 0.005 % ophthalmic solution, , Disp: , Rfl:    levothyroxine (SYNTHROID) 100 MCG tablet, TAKE 1 TABLET BY MOUTH EVERY DAY, Disp: 90 tablet, Rfl: 1   amLODipine (NORVASC) 2.5 MG tablet, Take 1  tablet (2.5 mg total) by mouth daily., Disp: 90 tablet, Rfl: 2   Semaglutide, 1 MG/DOSE, (OZEMPIC, 1 MG/DOSE,) 4 MG/3ML SOPN, USE ONCE WEEKLY., Disp: 3 mL, Rfl: 5   valsartan-hydrochlorothiazide (DIOVAN-HCT) 160-12.5 MG tablet, Take 1 tablet by mouth daily., Disp: 90 tablet, Rfl: 2   Allergies  Allergen Reactions   Shellfish Allergy Anaphylaxis   Shellfish Allergy      Review of Systems  Constitutional: Negative.  Negative for fatigue.   Respiratory: Negative.    Cardiovascular: Negative.  Negative for chest pain.  Gastrointestinal: Negative.   Endocrine: Negative for polydipsia, polyphagia and polyuria.  Neurological: Negative.  Negative for weakness and headaches.  Psychiatric/Behavioral: Negative.       Today's Vitals   06/04/23 1142  BP: 130/74  Pulse: 84  Temp: 98.5 F (36.9 C)  SpO2: 98%  Weight: 167 lb 12.8 oz (76.1 kg)  Height: 5\' 4"  (1.626 m)   Body mass index is 28.8 kg/m.  Wt Readings from Last 3 Encounters:  06/04/23 167 lb 12.8 oz (76.1 kg)  02/03/23 169 lb (76.7 kg)  09/01/22 169 lb 6.4 oz (76.8 kg)     Objective:  Physical Exam Vitals and nursing note reviewed.  Constitutional:      Appearance: Normal appearance.  HENT:     Head: Normocephalic and atraumatic.  Eyes:     Extraocular Movements: Extraocular movements intact.  Cardiovascular:     Rate and Rhythm: Normal rate and regular rhythm.     Pulses:          Dorsalis pedis pulses are 2+ on the right side and 2+ on the left side.     Heart sounds: Normal heart sounds.  Pulmonary:     Effort: Pulmonary effort is normal.     Breath sounds: Normal breath sounds.  Musculoskeletal:     Cervical back: Normal range of motion.  Feet:     Right foot:     Protective Sensation: 5 sites tested.  5 sites sensed.     Skin integrity: Dry skin present.     Toenail Condition: Right toenails are normal.     Left foot:     Protective Sensation: 5 sites tested.  5 sites sensed.     Skin integrity: Dry skin present.     Toenail Condition: Left toenails are normal.  Skin:    General: Skin is warm.  Neurological:     General: No focal deficit present.     Mental Status: She is alert.  Psychiatric:        Mood and Affect: Mood normal.        Behavior: Behavior normal.         Assessment And Plan:  Uncontrolled type 2 diabetes mellitus with hyperglycemia (HCC) Assessment & Plan: Chronic, diabetic foot exam was performed.  She is  currently taking Ozempic 1mg  weekly and Farxiga 10mg  daily. She will f/u in four months for re-evaluation.   Orders: -     Microalbumin / creatinine urine ratio -     CBC -     Hemoglobin A1c -     BMP8+eGFR  Essential hypertension, benign Assessment & Plan: Chronic, fair control. She will continue with amlodipine 2.5mg  daily and valsartan/hct 160/12.5mg  daily. She is advised goal BP is less than 120/80. Advised to follow low sodium diet.   Orders: -     Valsartan-hydroCHLOROthiazide; Take 1 tablet by mouth daily.  Dispense: 90 tablet; Refill: 2 -     amLODIPine Besylate;  Take 1 tablet (2.5 mg total) by mouth daily.  Dispense: 90 tablet; Refill: 2  Primary hypothyroidism Assessment & Plan: I will check thyroid panel and adjust meds as needed.  She is currently taking levothyroxine daily.    Orders: -     TSH + free T4  Estrogen deficiency Assessment & Plan: [facility] She is encouraged to engage in weight-bearing exercises at least three days weekly.   Orders: -     DG Bone Density; Future  Immunization due -     Flu Vaccine Trivalent High Dose (Fluad)     Return for 4 MONTH DM.  Patient was given opportunity to ask questions. Patient verbalized understanding of the plan and was able to repeat key elements of the plan. All questions were answered to their satisfaction.    I, Tanya Aliment, MD, have reviewed all documentation for this visit. The documentation on 06/04/23 for the exam, diagnosis, procedures, and orders are all accurate and complete.   IF YOU HAVE BEEN REFERRED TO A SPECIALIST, IT MAY TAKE 1-2 WEEKS TO SCHEDULE/PROCESS THE REFERRAL. IF YOU HAVE NOT HEARD FROM US/SPECIALIST IN TWO WEEKS, PLEASE GIVE [place] A CALL AT [phone].   THE PATIENT IS ENCOURAGED TO PRACTICE SOCIAL DISTANCING DUE TO THE COVID-19 PANDEMIC.

## 2023-06-04 NOTE — Assessment & Plan Note (Signed)
I will schedule bone density at [facility]. She is encouraged to engage in weight-bearing exercises at least three days weekly.

## 2023-06-04 NOTE — Assessment & Plan Note (Signed)
Chronic, fair control. She will continue with amlodipine 2.5mg  daily and valsartan/hct 160/12.5mg  daily. She is advised goal BP is less than 120/80. Advised to follow low sodium diet.

## 2023-06-04 NOTE — Assessment & Plan Note (Signed)
I will check thyroid panel and adjust meds as needed.  She is currently taking levothyroxine daily.

## 2023-06-05 LAB — BMP8+EGFR
BUN/Creatinine Ratio: 22 (ref 12–28)
BUN: 17 mg/dL (ref 8–27)
CO2: 24 mmol/L (ref 20–29)
Calcium: 9.8 mg/dL (ref 8.7–10.3)
Chloride: 104 mmol/L (ref 96–106)
Creatinine, Ser: 0.78 mg/dL (ref 0.57–1.00)
Glucose: 93 mg/dL (ref 70–99)
Potassium: 4 mmol/L (ref 3.5–5.2)
Sodium: 143 mmol/L (ref 134–144)
eGFR: 82 mL/min/{1.73_m2} (ref >59)

## 2023-06-05 LAB — CBC
Hematocrit: 37.4 % (ref 34.0–46.6)
Hemoglobin: 11.6 g/dL (ref 11.1–15.9)
MCH: 26.7 pg (ref 26.6–33.0)
MCHC: 31 g/dL — ABNORMAL LOW (ref 31.5–35.7)
MCV: 86 fL (ref 79–97)
Platelets: 220 10*3/uL (ref 150–450)
RBC: 4.35 x10E6/uL (ref 3.77–5.28)
RDW: 14.2 % (ref 11.7–15.4)
WBC: 5.5 10*3/uL (ref 3.4–10.8)

## 2023-06-05 LAB — MICROALBUMIN / CREATININE URINE RATIO
Creatinine, Urine: 86.1 mg/dL
Microalb/Creat Ratio: <3 mg/g creat (ref 0–29)
Microalbumin, Urine: <3 ug/mL

## 2023-06-05 LAB — HEMOGLOBIN A1C
Est. average glucose Bld gHb Est-mCnc: 157 mg/dL
Hgb A1c MFr Bld: 7.1 % — ABNORMAL HIGH (ref 4.8–5.6)

## 2023-06-05 LAB — TSH+FREE T4
Free T4: 1.41 ng/dL (ref 0.82–1.77)
TSH: 0.325 u[IU]/mL — ABNORMAL LOW (ref 0.450–4.500)

## 2023-06-29 LAB — HM DIABETES EYE EXAM

## 2023-07-01 ENCOUNTER — Other Ambulatory Visit: Payer: Self-pay

## 2023-07-01 ENCOUNTER — Encounter: Payer: Self-pay | Admitting: Internal Medicine

## 2023-07-01 DIAGNOSIS — E039 Hypothyroidism, unspecified: Secondary | ICD-10-CM

## 2023-07-01 DIAGNOSIS — R921 Mammographic calcification found on diagnostic imaging of breast: Secondary | ICD-10-CM

## 2023-08-18 ENCOUNTER — Ambulatory Visit
Admission: RE | Admit: 2023-08-18 | Discharge: 2023-08-18 | Disposition: A | Discharge status: Home / Self Care | Payer: Medicare Other | Source: Ambulatory Visit | Attending: Internal Medicine | Admitting: Internal Medicine

## 2023-08-18 DIAGNOSIS — R921 Mammographic calcification found on diagnostic imaging of breast: Secondary | ICD-10-CM

## 2023-08-19 ENCOUNTER — Other Ambulatory Visit: Payer: Self-pay | Admitting: Internal Medicine

## 2023-08-19 DIAGNOSIS — R921 Mammographic calcification found on diagnostic imaging of breast: Secondary | ICD-10-CM

## 2023-08-26 ENCOUNTER — Encounter: Payer: Self-pay | Admitting: Internal Medicine

## 2023-09-24 ENCOUNTER — Other Ambulatory Visit: Payer: Self-pay | Admitting: Internal Medicine

## 2023-09-24 ENCOUNTER — Other Ambulatory Visit: Payer: Self-pay

## 2023-09-24 DIAGNOSIS — E1165 Type 2 diabetes mellitus with hyperglycemia: Secondary | ICD-10-CM

## 2023-09-24 MED ORDER — DAPAGLIFLOZIN PROPANEDIOL 10 MG PO TABS
ORAL_TABLET | ORAL | 2 refills | Status: DC
Start: 2023-09-24 — End: 2023-09-25

## 2023-09-24 MED ORDER — DAPAGLIFLOZIN PROPANEDIOL 10 MG PO TABS
ORAL_TABLET | ORAL | 2 refills | Status: DC
Start: 2023-09-24 — End: 2023-09-24

## 2023-09-25 ENCOUNTER — Other Ambulatory Visit: Payer: Self-pay

## 2023-09-25 DIAGNOSIS — E1165 Type 2 diabetes mellitus with hyperglycemia: Secondary | ICD-10-CM

## 2023-09-25 MED ORDER — DAPAGLIFLOZIN PROPANEDIOL 10 MG PO TABS: 10.0000 mg | ORAL_TABLET | Freq: Every day | ORAL | 1 refills | Status: DC

## 2023-09-29 ENCOUNTER — Other Ambulatory Visit: Payer: Self-pay

## 2023-09-29 DIAGNOSIS — E1165 Type 2 diabetes mellitus with hyperglycemia: Secondary | ICD-10-CM

## 2023-09-29 MED ORDER — DAPAGLIFLOZIN PROPANEDIOL 10 MG PO TABS: 10.0000 mg | ORAL_TABLET | Freq: Every day | ORAL | 1 refills | Status: AC

## 2023-10-14 ENCOUNTER — Ambulatory Visit: Payer: Medicare Other | Admitting: Internal Medicine

## 2023-10-14 ENCOUNTER — Encounter: Payer: Self-pay | Admitting: Internal Medicine

## 2023-10-14 VITALS — BP 110/74 | HR 68 | Temp 98.2°F | Ht 64.0 in | Wt 171.0 lb

## 2023-10-14 DIAGNOSIS — E039 Hypothyroidism, unspecified: Secondary | ICD-10-CM

## 2023-10-14 DIAGNOSIS — I10 Essential (primary) hypertension: Secondary | ICD-10-CM | POA: Diagnosis not present

## 2023-10-14 DIAGNOSIS — E2839 Other primary ovarian failure: Secondary | ICD-10-CM

## 2023-10-14 DIAGNOSIS — Z23 Encounter for immunization: Secondary | ICD-10-CM

## 2023-10-14 DIAGNOSIS — E1165 Type 2 diabetes mellitus with hyperglycemia: Secondary | ICD-10-CM

## 2023-10-14 NOTE — Assessment & Plan Note (Signed)
She is scheduled for Dexa May 2025 at the [facility]. She is encouraged to engage in weight-bearing exercises at least three days weekly.

## 2023-10-14 NOTE — Assessment & Plan Note (Signed)
Chronic.  She is currently taking Ozempic 1mg  weekly and Farxiga 10mg  daily. She was commended on her lifestyle changes. She will f/u in four months for re-evaluation.

## 2023-10-14 NOTE — Assessment & Plan Note (Signed)
Chronic, well controlled. She will continue with amlodipine 2.5mg  daily and valsartan/hct 160/12.5mg  daily. She is advised goal BP is less than 120/80. Advised to follow low sodium diet.

## 2023-10-14 NOTE — Patient Instructions (Signed)

## 2023-10-14 NOTE — Progress Notes (Signed)
I,Tanya Levy, CMA,acting as a Neurosurgeon for Tanya Aliment, MD.,have documented all relevant documentation on the behalf of Tanya Aliment, MD,as directed by  Tanya Aliment, MD while in the presence of Tanya Aliment, MD.  Subjective:  Patient ID: Tanya Levy , female    DOB: 1951/06/29 , 73 y.o.   MRN: [id number]  Chief Complaint  Patient presents with   Diabetes   Hypertension   Hypothyroidism    HPI  Patient presents today for DM and BP & thyroid check.  She reports compliance with medications and has no other concerns today. Denies headache, chest pain & SOB.  She states she has been moving more frequently. She does exercises during commercials when watching TV.      Diabetes She presents for her follow-up diabetic visit. She has type 2 diabetes mellitus. Her disease course has been improving. There are no hypoglycemic associated symptoms. Pertinent negatives for hypoglycemia include no headaches. Pertinent negatives for diabetes include no chest pain, no fatigue, no polydipsia, no polyphagia, no polyuria and no weakness. There are no hypoglycemic complications. Risk factors for coronary artery disease include diabetes mellitus, dyslipidemia, hypertension, obesity, sedentary lifestyle and post-menopausal. She is compliant with treatment most of the time. She is following a diabetic diet. She participates in exercise intermittently. Her breakfast blood glucose is taken between 8-9 am. Her breakfast blood glucose range is generally 110-130 mg/dl. An ACE inhibitor/angiotensin II receptor blocker is being taken. Eye exam is current.  Hypertension This is a chronic problem. The current episode started more than 1 year ago. The problem has been gradually improving since onset. The problem is controlled. Pertinent negatives include no chest pain or headaches. Risk factors for coronary artery disease include diabetes mellitus, dyslipidemia, obesity, sedentary lifestyle and  post-menopausal state. Compliance problems include exercise.      Past Medical History:  Diagnosis Date   Diabetes mellitus (HCC)    Diabetes mellitus without complication (HCC)    History of chicken pox    History of measles    History of mumps    Hypertension    Tilted uterus    UTI (urinary tract infection)      Family History  Problem Relation Age of Onset   Hypertension Mother    Hypertension Father      Current Outpatient Medications:    amLODipine (NORVASC) 2.5 MG tablet, Take 1 tablet (2.5 mg total) by mouth daily., Disp: 90 tablet, Rfl: 2   aspirin 81 MG EC tablet, Take 81 mg by mouth daily. Swallow whole., Disp: , Rfl:    b complex vitamins tablet, Take 1 tablet by mouth daily. 3 times per week, Disp: , Rfl:    CRESTOR 20 MG tablet, Take 1 tablet (20 mg total) by mouth daily., Disp: 90 tablet, Rfl: 3   dapagliflozin propanediol (FARXIGA) 10 MG TABS tablet, Take 1 tablet (10 mg total) by mouth daily before breakfast., Disp: 90 tablet, Rfl: 1   diphenhydrAMINE (BENADRYL) 25 MG tablet, Take 1 tablet (25 mg total) by mouth every 6 (six) hours as needed for itching or allergies (Rash)., Disp: 30 tablet, Rfl: 0   dorzolamide-timolol (COSOPT) 22.3-6.8 MG/ML ophthalmic solution, SMARTSIG:In Eye(s), Disp: , Rfl:    EPINEPHrine 0.3 mg/0.3 mL IJ SOAJ injection, EpiPen 2-Pak 0.3 mg/0.3 mL injection, auto-injector, Disp: , Rfl:    fluticasone (FLONASE) 50 MCG/ACT nasal spray, SPRAY 2 SPRAYS INTO EACH NOSTRIL EVERY DAY, Disp: 48 mL, Rfl: 2   latanoprost (XALATAN) 0.005 %  ophthalmic solution, , Disp: , Rfl:    levothyroxine (SYNTHROID) 100 MCG tablet, TAKE 1 TABLET BY MOUTH EVERY DAY, Disp: 90 tablet, Rfl: 1   Semaglutide, 1 MG/DOSE, (OZEMPIC, 1 MG/DOSE,) 4 MG/3ML SOPN, USE ONCE WEEKLY., Disp: 3 mL, Rfl: 5   valsartan-hydrochlorothiazide (DIOVAN-HCT) 160-12.5 MG tablet, Take 1 tablet by mouth daily., Disp: 90 tablet, Rfl: 2   Allergies  Allergen Reactions   Shellfish Allergy  Anaphylaxis   Shellfish Allergy      Review of Systems  Constitutional: Negative.  Negative for fatigue.  Respiratory: Negative.    Cardiovascular: Negative.  Negative for chest pain.  Endocrine: Negative for polydipsia, polyphagia and polyuria.  Neurological: Negative.  Negative for weakness and headaches.  Psychiatric/Behavioral: Negative.       Today's Vitals   10/14/23 0952  BP: 110/74  Pulse: 68  Temp: 98.2 F (36.8 C)  SpO2: 98%  Weight: 171 lb (77.6 kg)  Height: 5\' 4"  (1.626 m)   Body mass index is 29.35 kg/m.  Wt Readings from Last 3 Encounters:  10/14/23 171 lb (77.6 kg)  06/04/23 167 lb 12.8 oz (76.1 kg)  02/03/23 169 lb (76.7 kg)     Objective:  Physical Exam Vitals and nursing note reviewed.  Constitutional:      Appearance: Normal appearance. She is obese.  HENT:     Head: Normocephalic and atraumatic.  Eyes:     Extraocular Movements: Extraocular movements intact.  Cardiovascular:     Rate and Rhythm: Normal rate and regular rhythm.     Heart sounds: Normal heart sounds.  Pulmonary:     Effort: Pulmonary effort is normal.     Breath sounds: Normal breath sounds.  Skin:    General: Skin is warm.  Neurological:     General: No focal deficit present.     Mental Status: She is alert.  Psychiatric:        Mood and Affect: Mood normal.        Behavior: Behavior normal.         Assessment And Plan:  Uncontrolled type 2 diabetes mellitus with hyperglycemia (HCC) Assessment & Plan: Chronic.  She is currently taking Ozempic 1mg  weekly and Farxiga 10mg  daily. She was commended on her lifestyle changes. She will f/u in four months for re-evaluation.   Orders: -     CMP14+EGFR -     Hemoglobin A1c  Essential hypertension, benign Assessment & Plan: Chronic, well controlled. She will continue with amlodipine 2.5mg  daily and valsartan/hct 160/12.5mg  daily. She is advised goal BP is less than 120/80. Advised to follow low sodium diet.   Orders: -      CMP14+EGFR  Primary hypothyroidism Assessment & Plan: I will check thyroid panel and adjust meds as needed.  She is currently taking levothyroxine daily.    Orders: -     TSH  Estrogen deficiency Assessment & Plan: She is scheduled for Dexa May 2025 at the [facility]. She is encouraged to engage in weight-bearing exercises at least three days weekly.    Immunization due -     Pfizer Comirnaty Covid-19 Vaccine 7yrs & older     Return for 4 month dm f/u. Marland Kitchen  Patient was given opportunity to ask questions. Patient verbalized understanding of the plan and was able to repeat key elements of the plan. All questions were answered to their satisfaction.    I, Tanya Aliment, MD, have reviewed all documentation for this visit. The documentation on 10/14/23 for  the exam, diagnosis, procedures, and orders are all accurate and complete.   IF YOU HAVE BEEN REFERRED TO A SPECIALIST, IT MAY TAKE 1-2 WEEKS TO SCHEDULE/PROCESS THE REFERRAL. IF YOU HAVE NOT HEARD FROM US/SPECIALIST IN TWO WEEKS, PLEASE GIVE [place] A CALL AT [phone].   THE PATIENT IS ENCOURAGED TO PRACTICE SOCIAL DISTANCING DUE TO THE COVID-19 PANDEMIC.

## 2023-10-14 NOTE — Assessment & Plan Note (Signed)
I will check thyroid panel and adjust meds as needed.  She is currently taking levothyroxine daily.

## 2023-10-15 LAB — CMP14+EGFR
ALT: 21 [IU]/L (ref 0–32)
AST: 19 [IU]/L (ref 0–40)
Albumin: 4.4 g/dL (ref 3.8–4.8)
Alkaline Phosphatase: 77 [IU]/L (ref 44–121)
BUN/Creatinine Ratio: 18 (ref 12–28)
BUN: 17 mg/dL (ref 8–27)
Bilirubin Total: 0.3 mg/dL (ref 0.0–1.2)
CO2: 22 mmol/L (ref 20–29)
Calcium: 9.7 mg/dL (ref 8.7–10.3)
Chloride: 103 mmol/L (ref 96–106)
Creatinine, Ser: 0.92 mg/dL (ref 0.57–1.00)
Globulin, Total: 3.1 g/dL (ref 1.5–4.5)
Glucose: 101 mg/dL — ABNORMAL HIGH (ref 70–99)
Potassium: 4.6 mmol/L (ref 3.5–5.2)
Sodium: 144 mmol/L (ref 134–144)
Total Protein: 7.5 g/dL (ref 6.0–8.5)
eGFR: 67 mL/min/{1.73_m2} (ref >59)

## 2023-10-15 LAB — TSH: TSH: 2.77 u[IU]/mL (ref 0.450–4.500)

## 2023-10-15 LAB — HEMOGLOBIN A1C
Est. average glucose Bld gHb Est-mCnc: 148 mg/dL
Hgb A1c MFr Bld: 6.8 % — ABNORMAL HIGH (ref 4.8–5.6)

## 2023-11-20 ENCOUNTER — Other Ambulatory Visit: Payer: Self-pay | Admitting: Internal Medicine

## 2023-11-20 DIAGNOSIS — E1165 Type 2 diabetes mellitus with hyperglycemia: Secondary | ICD-10-CM

## 2024-01-28 ENCOUNTER — Ambulatory Visit
Admission: RE | Admit: 2024-01-28 | Discharge: 2024-01-28 | Disposition: A | Discharge status: Home / Self Care | Payer: Medicare Other | Source: Ambulatory Visit | Attending: Internal Medicine | Admitting: Internal Medicine

## 2024-01-28 DIAGNOSIS — E2839 Other primary ovarian failure: Secondary | ICD-10-CM

## 2024-01-29 ENCOUNTER — Ambulatory Visit: Payer: Self-pay | Admitting: Internal Medicine

## 2024-02-08 ENCOUNTER — Other Ambulatory Visit: Payer: Self-pay | Admitting: Internal Medicine

## 2024-02-11 ENCOUNTER — Ambulatory Visit (INDEPENDENT_AMBULATORY_CARE_PROVIDER_SITE_OTHER): Payer: Medicare Other | Admitting: Internal Medicine

## 2024-02-11 ENCOUNTER — Encounter: Payer: Self-pay | Admitting: Internal Medicine

## 2024-02-11 ENCOUNTER — Encounter: Payer: Self-pay | Admitting: Gastroenterology

## 2024-02-11 VITALS — BP 110/70 | HR 93 | Temp 98.6°F | Ht 64.0 in | Wt 175.4 lb

## 2024-02-11 DIAGNOSIS — E66811 Obesity, class 1: Secondary | ICD-10-CM

## 2024-02-11 DIAGNOSIS — Z860102 Personal history of hyperplastic colon polyps: Secondary | ICD-10-CM

## 2024-02-11 DIAGNOSIS — R202 Paresthesia of skin: Secondary | ICD-10-CM | POA: Diagnosis not present

## 2024-02-11 DIAGNOSIS — E785 Hyperlipidemia, unspecified: Secondary | ICD-10-CM | POA: Diagnosis not present

## 2024-02-11 DIAGNOSIS — E1169 Type 2 diabetes mellitus with other specified complication: Secondary | ICD-10-CM | POA: Diagnosis not present

## 2024-02-11 DIAGNOSIS — R296 Repeated falls: Secondary | ICD-10-CM

## 2024-02-11 DIAGNOSIS — I10 Essential (primary) hypertension: Secondary | ICD-10-CM | POA: Diagnosis not present

## 2024-02-11 DIAGNOSIS — Z79899 Other long term (current) drug therapy: Secondary | ICD-10-CM

## 2024-02-11 DIAGNOSIS — Z6830 Body mass index (BMI) 30.0-30.9, adult: Secondary | ICD-10-CM

## 2024-02-11 NOTE — Progress Notes (Signed)
 I,Tanya Levy, CMA,acting as a Neurosurgeon for Smiley Dung, MD.,have documented all relevant documentation on the behalf of Smiley Dung, MD,as directed by  Smiley Dung, MD while in the presence of Smiley Dung, MD.  Subjective:  Patient ID: Tanya Levy , female    DOB: [birth year] , 73 y.o.   MRN: [id number]  Chief Complaint  Patient presents with   Diabetes    Patient presents today for a DM Check. Patient doesn't have any specific questions or concerns. Denies headache, chest pain & sob.   Hypertension   Hypothyroidism    HPI Discussed the use of AI scribe software for clinical note transcription with the patient, who gave verbal consent to proceed.  History of Present Illness Tanya Levy "Tanya Levy" is a 73 year old female with diabetes and hypertension who presents for a diabetes and blood pressure check.  Her blood sugar levels range from 110 to 160 mg/dL, with higher readings postprandially. Morning blood sugar levels are typically between 110 and 125 mg/dL. She is currently on Ozempic  1 mg weekly for diabetes management.  She has experienced three falls since her last visit. The first fall occurred due to wearing oversized boots without socks, resulting in a knee injury for which she received a cortisone shot at an orthopedic urgent care. 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 The third fall occurred at church due to slipping on a puddle of water, leading to an arm injury that required urgent care, x-rays, and a sling. No dizziness, lightheadedness, or feeling off balance. She reports tingling in the fingers of her right hand, which has improved since the initial injury. No tingling in her feet.  Her current medications include valsartan  160/12.5 mg daily, levothyroxine  100 mcg, Flonase , Farxiga  10 mg daily, rosuvastatin  20 mg daily, aspirin, and amlodipine ,  which she takes at night. She has started taking Centrum Over 50 because it contains calcium  and vitamin D.   Diabetes She presents for her follow-up diabetic visit. She has type 2 diabetes mellitus. Her disease course has been improving. There are no hypoglycemic associated symptoms. Pertinent negatives for hypoglycemia include no headaches. Pertinent negatives for diabetes include no chest pain, no fatigue, no polydipsia, no polyphagia, no polyuria and no weakness. There are no hypoglycemic complications. Risk factors for coronary artery disease include diabetes mellitus, dyslipidemia, hypertension, obesity, sedentary lifestyle and post-menopausal. She is compliant with treatment most of the time. She is following a diabetic diet. She participates in exercise intermittently. Her breakfast blood glucose is taken between 8-9 am. Her breakfast blood glucose range is generally 110-130 mg/dl. An ACE inhibitor/angiotensin II receptor blocker is being taken. Eye exam is current.  Hypertension This is a chronic problem. The current episode started more than 1 year ago. The problem has been gradually improving since onset. The problem is controlled. Pertinent negatives include no chest pain or headaches. Risk factors for coronary artery disease include diabetes mellitus, dyslipidemia, obesity, sedentary lifestyle and post-menopausal state. Compliance problems include exercise.      Past Medical History:  Diagnosis Date   Diabetes mellitus (HCC)    Diabetes mellitus without complication (HCC)    History of chicken pox    History of measles    History of mumps    Hypertension    Tilted uterus    UTI (urinary tract infection)      Family History  Problem Relation Age of Onset   Hypertension Mother    Hypertension Father      Current Outpatient Medications:    amLODipine  (NORVASC ) 2.5 MG tablet, Take 1 tablet (2.5 mg total) by mouth daily., Disp: 90 tablet, Rfl: 2   aspirin 81 MG EC tablet, Take  81 mg by mouth daily. Swallow whole., Disp: , Rfl:    b complex vitamins tablet, Take 1 tablet by mouth daily. 3 times per week, Disp: , Rfl:    CRESTOR  20 MG tablet, TAKE 1 TABLET BY MOUTH EVERY DAY, Disp: 90 tablet, Rfl: 3   dapagliflozin  propanediol (FARXIGA ) 10 MG TABS tablet, Take 1 tablet (10 mg total) by mouth daily before breakfast., Disp: 90 tablet, Rfl: 1   diphenhydrAMINE  (BENADRYL ) 25 MG tablet, Take 1 tablet (25 mg total) by mouth every 6 (six) hours as needed for itching or allergies (Rash)., Disp: 30 tablet, Rfl: 0   dorzolamide-timolol (COSOPT) 22.3-6.8 MG/ML ophthalmic solution, SMARTSIG:In Eye(s), Disp: , Rfl:    EPINEPHrine  0.3 mg/0.3 mL IJ SOAJ injection, EpiPen  2-Pak 0.3 mg/0.3 mL injection, auto-injector, Disp: , Rfl:    fluticasone  (FLONASE ) 50 MCG/ACT nasal spray, SPRAY 2 SPRAYS INTO EACH NOSTRIL EVERY DAY, Disp: 48 mL, Rfl: 2   latanoprost (XALATAN) 0.005 % ophthalmic solution, , Disp: , Rfl:    levothyroxine  (SYNTHROID ) 100 MCG tablet, TAKE 1 TABLET BY MOUTH EVERY DAY, Disp: 90 tablet, Rfl: 1   Semaglutide , 1 MG/DOSE, (OZEMPIC , 1 MG/DOSE,) 4 MG/3ML SOPN, USE ONCE WEEKLY, Disp: 3 mL, Rfl: 5   valsartan -hydrochlorothiazide  (DIOVAN -HCT) 160-12.5 MG tablet, Take 1 tablet by mouth daily., Disp: 90 tablet, Rfl: 2   Allergies  Allergen Reactions   Shellfish Allergy Anaphylaxis   Shellfish Allergy      Review of Systems  Constitutional: Negative.  Negative for fatigue.  HENT: Negative.    Eyes: Negative.   Respiratory: Negative.    Cardiovascular: Negative.  Negative for chest pain.  Gastrointestinal: Negative.   Endocrine: Negative for polydipsia, polyphagia and polyuria.  Skin: Negative.   Neurological:  Negative for weakness and headaches.     Today's Vitals   02/11/24 0930  BP: 110/70  Pulse: 93  Temp: 98.6 F (37 C)  SpO2: 98%  Weight: 175 lb 6.4 oz (79.6 kg)  Height: 5\' 4"  (1.626 m)   Body mass index is 30.11 kg/m.  Wt Readings from Last 3  Encounters:  02/11/24 175 lb 6.4 oz (79.6 kg)  10/14/23 171 lb (77.6 kg)  06/04/23 167 lb 12.8 oz (76.1 kg)    The 10-year ASCVD risk score (Arnett DK, et al., 2019) is: 16.8%   Values used to calculate the score:     Age: 18 years     Sex: Female     Is Non-Hispanic African American: Yes     Diabetic: Yes     Tobacco smoker: No     Systolic Blood Pressure: 110 mmHg     Is BP treated: Yes     HDL Cholesterol: 41 mg/dL     Total Cholesterol: 156 mg/dL  Objective:  Physical Exam Vitals and nursing note reviewed.  Constitutional:      Appearance: Normal appearance. She is obese.  HENT:     Head: Normocephalic and atraumatic.  Eyes:     Extraocular Movements: Extraocular movements intact.  Cardiovascular:     Rate and Rhythm: Normal rate and regular rhythm.     Heart sounds: Normal heart sounds.  Pulmonary:     Effort:  Pulmonary effort is normal.     Breath sounds: Normal breath sounds.  Musculoskeletal:        General: Tenderness present.     Cervical back: Normal range of motion.  Skin:    General: Skin is warm.  Neurological:     General: No focal deficit present.     Mental Status: She is alert.  Psychiatric:        Mood and Affect: Mood normal.        Behavior: Behavior normal.       Assessment And Plan:  Dyslipidemia associated with type 2 diabetes mellitus (HCC) Assessment & Plan: Chronic, blood glucose levels range from 110 to 160 mg/dL, higher postprandially. Morning levels 110 to 125 mg/dL. Managed with Ozempic . - Continue Ozempic  1 mg weekly. - Importance of medication/dietary compliance was discussed with the patient - LDL goal is less than 70 - Continue with rosuvastatin   Orders: -     Lipid panel -     Hemoglobin A1c -     CMP14+EGFR  Essential hypertension, benign Assessment & Plan: Chronic, well controlled. She will continue with amlodipine  2.5mg  daily and valsartan /hct 160/12.5mg  daily. She is advised goal BP is less than 120/80. Advised to  follow low sodium diet.   Orders: -     Lipid panel -     CMP14+EGFR  Paresthesias in right hand Assessment & Plan: Tingling in right hand fingers likely from trauma. Improvement noted. B12 deficiency considered. - Order B12 level. - Recommend over-the-counter Voltaren gel for joint inflammation.   Recurrent falls Assessment & Plan: Multiple falls due to external factors. No dizziness or balance issues. Physical therapy considered for strength improvement. B12 level to be checked. - Order B12 level. - Refer to physical therapy for core and ankle strengthening. - Advise home exercises including heel raises and sit-to-stand exercises.  Orders: -     Ambulatory referral to Physical Therapy  Class 1 obesity with body mass index (BMI) of 30.0 to 30.9 in adult, unspecified obesity type, unspecified whether serious comorbidity present Assessment & Plan: Her BMI is acceptable for her demographic.  She is encouraged to aim for at least 150 minutes of exercise per week.    Drug therapy -     Vitamin B12  Personal history of hyperplastic colon polyps -     Ambulatory referral to Gastroenterology   Return in 4 months (on 06/13/2024), or dm check.  Patient was given opportunity to ask questions. Patient verbalized understanding of the plan and was able to repeat key elements of the plan. All questions were answered to their satisfaction.    I, Smiley Dung, MD, have reviewed all documentation for this visit. The documentation on 02/11/24 for the exam, diagnosis, procedures, and orders are all accurate and complete. c  IF YOU HAVE BEEN REFERRED TO A SPECIALIST, IT MAY TAKE 1-2 WEEKS TO SCHEDULE/PROCESS THE REFERRAL. IF YOU HAVE NOT HEARD FROM US /SPECIALIST IN TWO WEEKS, PLEASE GIVE US  A CALL AT [phone].

## 2024-02-11 NOTE — Patient Instructions (Signed)
Fall Prevention in the Home, Adult Falls can cause injuries and affect people of all ages. There are many simple things that you can do to make your home safe and to help prevent falls. If you need it, ask for help making these changes. What actions can I take to prevent falls? General information Use good lighting in all rooms. Make sure to: Replace any light bulbs that burn out. Turn on lights if it is dark and use night-lights. Keep items that you use often in easy-to-reach places. Lower the shelves around your home if needed. Move furniture so that there are clear paths around it. Do not keep throw rugs or other things on the floor that can make you trip. If any of your floors are uneven, fix them. Add color or contrast paint or tape to clearly mark and help you see: Grab bars or handrails. First and last steps of staircases. Where the edge of each step is. If you use a ladder or stepladder: Make sure that it is fully opened. Do not climb a closed ladder. Make sure the sides of the ladder are locked in place. Have someone hold the ladder while you use it. Know where your pets are as you move through your home. What can I do in the bathroom?     Keep the floor dry. Clean up any water that is on the floor right away. Remove soap buildup in the bathtub or shower. Buildup makes bathtubs and showers slippery. Use non-skid mats or decals on the floor of the bathtub or shower. Attach bath mats securely with double-sided, non-slip rug tape. If you need to sit down while you are in the shower, use a non-slip stool. Install grab bars by the toilet and in the bathtub and shower. Do not use towel bars as grab bars. What can I do in the bedroom? Make sure that you have a light by your bed that is easy to reach. Do not use any sheets or blankets on your bed that hang to the floor. Have a firm bench or chair with side arms that you can use for support when you get dressed. What can I do in  the kitchen? Clean up any spills right away. If you need to reach something above you, use a sturdy step stool that has a grab bar. Keep electrical cables out of the way. Do not use floor polish or wax that makes floors slippery. What can I do with my stairs? Do not leave anything on the stairs. Make sure that you have a light switch at the top and the bottom of the stairs. Have them installed if you do not have them. Make sure that there are handrails on both sides of the stairs. Fix handrails that are broken or loose. Make sure that handrails are as long as the staircases. Install non-slip stair treads on all stairs in your home if they do not have carpet. Avoid having throw rugs at the top or bottom of stairs, or secure the rugs with carpet tape to prevent them from moving. Choose a carpet design that does not hide the edge of steps on the stairs. Make sure that carpet is firmly attached to the stairs. Fix any carpet that is loose or worn. What can I do on the outside of my home? Use bright outdoor lighting. Repair the edges of walkways and driveways and fix any cracks. Clear paths of anything that can make you trip, such as tools or rocks. Add   color or contrast paint or tape to clearly mark and help you see high doorway thresholds. Trim any bushes or trees on the main path into your home. Check that handrails are securely fastened and in good repair. Both sides of all steps should have handrails. Install guardrails along the edges of any raised decks or porches. Have leaves, snow, and ice cleared regularly. Use sand, salt, or ice melt on walkways during winter months if you live where there is ice and snow. In the garage, clean up any spills right away, including grease or oil spills. What other actions can I take? Review your medicines with your health care provider. Some medicines can make you confused or feel dizzy. This can increase your chance of falling. Wear closed-toe shoes that  fit well and support your feet. Wear shoes that have rubber soles and low heels. Use a cane, walker, scooter, or crutches that help you move around if needed. Talk with your provider about other ways that you can decrease your risk of falls. This may include seeing a physical therapist to learn to do exercises to improve movement and strength. Where to find more information Centers for Disease Control and Prevention, [employer]AD[employer]: cdc.gov National Institute on Aging: nia.nih.gov National Institute on Aging: nia.nih.gov Contact a health care provider if: You are afraid of falling at home. You feel weak, drowsy, or dizzy at home. You fall at home. Get help right away if you: Lose consciousness or have trouble moving after a fall. Have a fall that causes a head injury. These symptoms may be an emergency. Get help right away. Call 911. Do not wait to see if the symptoms will go away. Do not drive yourself to the hospital. This information is not intended to replace advice given to you by your health care provider. Make sure you discuss any questions you have with your health care provider. Document Revised: 05/05/2022 Document Reviewed: 05/05/2022 Elsevier Patient Education  2024 Elsevier Inc.  

## 2024-02-12 LAB — CMP14+EGFR
ALT: 26 IU/L (ref 0–32)
AST: 21 IU/L (ref 0–40)
Albumin: 4.4 g/dL (ref 3.8–4.8)
Alkaline Phosphatase: 70 IU/L (ref 44–121)
BUN/Creatinine Ratio: 21 (ref 12–28)
BUN: 20 mg/dL (ref 8–27)
Bilirubin Total: 0.3 mg/dL (ref 0.0–1.2)
CO2: 21 mmol/L (ref 20–29)
Calcium: 9.5 mg/dL (ref 8.7–10.3)
Chloride: 100 mmol/L (ref 96–106)
Creatinine, Ser: 0.95 mg/dL (ref 0.57–1.00)
Globulin, Total: 2.7 g/dL (ref 1.5–4.5)
Glucose: 100 mg/dL — ABNORMAL HIGH (ref 70–99)
Potassium: 4.1 mmol/L (ref 3.5–5.2)
Sodium: 139 mmol/L (ref 134–144)
Total Protein: 7.1 g/dL (ref 6.0–8.5)
eGFR: 64 mL/min/{1.73_m2} (ref >59)

## 2024-02-12 LAB — LIPID PANEL
Chol/HDL Ratio: 3.8 ratio (ref 0.0–4.4)
Cholesterol, Total: 156 mg/dL (ref 100–199)
HDL: 41 mg/dL (ref >39)
LDL Chol Calc (NIH): 82 mg/dL (ref 0–99)
Triglycerides: 194 mg/dL — ABNORMAL HIGH (ref 0–149)
VLDL Cholesterol Cal: 33 mg/dL (ref 5–40)

## 2024-02-12 LAB — VITAMIN B12: Vitamin B-12: 980 pg/mL (ref 232–1245)

## 2024-02-12 LAB — HEMOGLOBIN A1C
Est. average glucose Bld gHb Est-mCnc: 151 mg/dL
Hgb A1c MFr Bld: 6.9 % — ABNORMAL HIGH (ref 4.8–5.6)

## 2024-02-13 NOTE — Assessment & Plan Note (Signed)
 Tingling in right hand fingers likely from trauma. Improvement noted. B12 deficiency considered. - Order B12 level. - Recommend over-the-counter Voltaren gel for joint inflammation.

## 2024-02-13 NOTE — Assessment & Plan Note (Signed)
Her BMI is acceptable for her demographic. She is encouraged to aim for at least 150 minutes of exercise per week.

## 2024-02-13 NOTE — Assessment & Plan Note (Addendum)
 Chronic, blood glucose levels range from 110 to 160 mg/dL, higher postprandially. Morning levels 110 to 125 mg/dL. Managed with Ozempic . - Continue Ozempic  1 mg weekly. - Importance of medication/dietary compliance was discussed with the patient - LDL goal is less than 70 - Continue with rosuvastatin 

## 2024-02-13 NOTE — Assessment & Plan Note (Signed)
 Chronic, well controlled. She will continue with amlodipine 2.5mg  daily and valsartan/hct 160/12.5mg  daily. She is advised goal BP is less than 120/80. Advised to follow low sodium diet.

## 2024-02-13 NOTE — Assessment & Plan Note (Signed)
 Multiple falls due to external factors. No dizziness or balance issues. Physical therapy considered for strength improvement. B12 level to be checked. - Order B12 level. - Refer to physical therapy for core and ankle strengthening. - Advise home exercises including heel raises and sit-to-stand exercises.

## 2024-02-17 ENCOUNTER — Encounter

## 2024-02-22 ENCOUNTER — Ambulatory Visit: Payer: Self-pay | Admitting: Internal Medicine

## 2024-02-23 ENCOUNTER — Encounter

## 2024-03-07 ENCOUNTER — Other Ambulatory Visit: Payer: Self-pay | Admitting: Internal Medicine

## 2024-03-07 DIAGNOSIS — I10 Essential (primary) hypertension: Secondary | ICD-10-CM

## 2024-03-15 ENCOUNTER — Ambulatory Visit
Admission: RE | Admit: 2024-03-15 | Discharge: 2024-03-15 | Disposition: A | Discharge status: Home / Self Care | Source: Ambulatory Visit | Attending: Internal Medicine | Admitting: Internal Medicine

## 2024-03-15 DIAGNOSIS — R921 Mammographic calcification found on diagnostic imaging of breast: Secondary | ICD-10-CM

## 2024-04-17 ENCOUNTER — Emergency Department (HOSPITAL_BASED_OUTPATIENT_CLINIC_OR_DEPARTMENT_OTHER)
Admission: EM | Admit: 2024-04-17 | Discharge: 2024-04-17 | Disposition: A | Discharge status: Home / Self Care | Attending: Emergency Medicine | Admitting: Emergency Medicine

## 2024-04-17 ENCOUNTER — Encounter (HOSPITAL_BASED_OUTPATIENT_CLINIC_OR_DEPARTMENT_OTHER): Payer: Self-pay

## 2024-04-17 ENCOUNTER — Other Ambulatory Visit: Payer: Self-pay

## 2024-04-17 DIAGNOSIS — M5412 Radiculopathy, cervical region: Secondary | ICD-10-CM | POA: Insufficient documentation

## 2024-04-17 DIAGNOSIS — E119 Type 2 diabetes mellitus without complications: Secondary | ICD-10-CM | POA: Insufficient documentation

## 2024-04-17 DIAGNOSIS — M79601 Pain in right arm: Secondary | ICD-10-CM | POA: Diagnosis not present

## 2024-04-17 DIAGNOSIS — Z7982 Long term (current) use of aspirin: Secondary | ICD-10-CM | POA: Diagnosis not present

## 2024-04-17 DIAGNOSIS — M25511 Pain in right shoulder: Secondary | ICD-10-CM | POA: Insufficient documentation

## 2024-04-17 DIAGNOSIS — I10 Essential (primary) hypertension: Secondary | ICD-10-CM | POA: Diagnosis not present

## 2024-04-17 DIAGNOSIS — Z79899 Other long term (current) drug therapy: Secondary | ICD-10-CM | POA: Diagnosis not present

## 2024-04-17 MED ORDER — KETOROLAC TROMETHAMINE 15 MG/ML IJ SOLN
15.0000 mg | Freq: Once | INTRAMUSCULAR | Status: AC
  Administered 2024-04-17: 15 mg via INTRAMUSCULAR
  Filled 2024-04-17: qty 1

## 2024-04-17 NOTE — ED Triage Notes (Signed)
 Pt reports R shoulder pain radiating to arm/hand. Pt reports a fall x1 month ago. Pt reports pain has returned x3 days ago. Pt went to [facility] and had x-ray done x2 days ago. Pt told she had inflammation in shoulder. Pt given cortisone shot.

## 2024-04-17 NOTE — Discharge Instructions (Signed)
 Ms. Izell was seen today for Right should and arm pain. A physical exam was performed which showed good range of motion with no red flag signs. We discussed that this may be a soft tissue injury or nerve pain coming from your neck. You were give a shot of Toradol  (an NSAID). If this continues, please follow up with your PCP and discuss getting imaging of your neck. Return to the ED if the pain becomes worse, function decreases in that arm, or other concerns develop.  Thank you for letting us  be a part of your care.

## 2024-04-17 NOTE — ED Provider Notes (Signed)
 [facility] resident visit.  Patient here with right shoulder pain from a fall about a month ago.  She has had multiple x-ray images done at the urgent care couple days ago had a cortisone shot in the shoulder.  Differential diagnosis is likely ongoing inflammatory process in the shoulder.  Could be a soft tissue injury.  She has got pretty good strength and sensation throughout however.  She does not really have any focal spinal pain.  Does not really appear to have neck pain.  Most of the pain does appear to be in her right shoulder.  She says she has been doing physical therapy taking over-the-counter pain meds.  Overall I do not think there is an acute emergency at this time.  I do not think there is any indication to do any MRIs of her shoulder or spine or brain.  Ultimately we will give her Toradol  shot here to give her some further support but recommend further pain management per primary care.  I think physical therapy focusing on the shoulder would be a good option as well.  This chart was dictated using voice recognition software.  Despite best efforts to proofread,  errors can occur which can change the documentation meaning.    Tanya Cornet, DO 04/17/24 [phone]

## 2024-04-17 NOTE — ED Provider Notes (Signed)
 [place] EMERGENCY DEPARTMENT AT [facility] Provider Note   CSN: [id number] Arrival date & time: 04/17/24  [id number]     Patient presents with: Shoulder Pain   Tanya Levy is Tanya Levy with Tanya PMH of HTN, DM, and HLD.  The patient presents today with Tanya 1 month history of shoulder, arm, and hand pain after Tanya fall. She reports that she was seen by the urgent care after her fall and was told her xrays showed no fracture and was told to take ibuprofen and tylenol for the pain. The pain improved for Tanya couple weeks but late last week, began hurting again with radiation into the hand. She then saw orthopedics who again took xrays which were benign and gave her Tanya steroid injection on Friday (7/31). Last night she said the pain kept her from sleeping so she came to the emergency room today. She has stopped taking ibuprofen, although it was helpful initially. She has difficulty describing the pain but states that it radiates from shoulder into the hand and sometimes feels like pins sticking her. Denies CP, SOB, decreased ROM, N/V/D.   Shoulder Pain Associated symptoms: no back pain, no fever and no neck pain        Prior to Admission medications   Medication Sig Start Date End Date Taking? Authorizing Provider  amLODipine  (NORVASC ) 2.5 MG tablet TAKE 1 TABLET BY MOUTH EVERY DAY 03/07/24   Jarold Medici, MD  aspirin 81 MG EC tablet Take 81 mg by mouth daily. Swallow whole.    [provider]  b complex vitamins tablet Take 1 tablet by mouth daily. 3 times per week    [provider]  CRESTOR  20 MG tablet TAKE 1 TABLET BY MOUTH EVERY DAY 02/09/24   Jarold Medici, MD  dapagliflozin  propanediol (FARXIGA ) 10 MG TABS tablet Take 1 tablet (10 mg total) by mouth daily before breakfast. 09/29/23   Jarold Medici, MD  diphenhydrAMINE  (BENADRYL ) 25 MG tablet Take 1 tablet (25 mg total) by mouth every 6 (six) hours as needed for itching or allergies (Rash).  01/01/16   Keith Sor, PA-C  dorzolamide-timolol (COSOPT) 22.3-6.8 MG/ML ophthalmic solution SMARTSIG:In Eye(s) 10/01/20   [provider]  EPINEPHrine  0.3 mg/0.3 mL IJ SOAJ injection EpiPen  2-Pak 0.3 mg/0.3 mL injection, auto-injector    [provider]  fluticasone  (FLONASE ) 50 MCG/ACT nasal spray SPRAY 2 SPRAYS INTO EACH NOSTRIL EVERY DAY 09/24/23   Jarold Medici, MD  latanoprost (XALATAN) 0.005 % ophthalmic solution  07/07/18   [provider]  levothyroxine  (SYNTHROID ) 100 MCG tablet TAKE 1 TABLET BY MOUTH EVERY DAY 03/07/24   Jarold Medici, MD  Semaglutide , 1 MG/DOSE, (OZEMPIC , 1 MG/DOSE,) 4 MG/3ML SOPN USE ONCE WEEKLY 11/23/23   Jarold Medici, MD  valsartan -hydrochlorothiazide  (DIOVAN -HCT) 160-12.5 MG tablet TAKE 1 TABLET BY MOUTH EVERY DAY 03/07/24   Jarold Medici, MD    Allergies: Shellfish allergy and Shellfish allergy    Review of Systems  Constitutional:  Negative for activity change, chills and fever.  Respiratory:  Negative for shortness of breath.   Cardiovascular:  Negative for chest pain and palpitations.  Gastrointestinal:  Negative for abdominal pain, diarrhea, nausea and vomiting.  Musculoskeletal:  Negative for back pain, gait problem, joint swelling, neck pain and neck stiffness.  Neurological:  Negative for tremors, weakness, light-headedness, numbness and headaches.    Updated Vital Signs BP (!) 155/81 (BP Location: Left Arm)   Pulse 94   Temp 98.6 Levy (37 C) (  Oral)   Resp 18   Ht 5' (1.524 m)   Wt 79.4 kg   SpO2 99%   BMI 34.18 kg/m   Physical Exam Constitutional:      General: She is not in acute distress.    Appearance: Normal appearance.  HENT:     Head: Normocephalic and atraumatic.  Neck:     Comments: Spurling's test positive with radiation to the RUE Cardiovascular:     Rate and Rhythm: Normal rate and regular rhythm.  Pulmonary:     Effort: Pulmonary effort is normal. No respiratory distress.  Musculoskeletal:         General: Tenderness present. No swelling or deformity. Normal range of motion.     Cervical back: Normal range of motion. No rigidity or tenderness.     Comments: Complete R shoulder exam performed. AROM/PROM intact. Empty can test negative, Speeds test negative. Spurling's positive for radiation into RUE  Neurological:     Mental Status: She is alert.     Comments: Muscle strength 5/5 in bilateral upper extremities     (all labs ordered are listed, but only abnormal results are displayed) Labs Reviewed - No data to display  EKG: None  Radiology: No results found.   Medications Ordered in the ED  ketorolac  (TORADOL ) 15 MG/ML injection 15 mg (has no administration in time range)     Medical Decision Making Risk Prescription drug management.   RUE Pain S/p fall on R shoulder  Tanya Levy presents today with 1 month hx of right arm pain. Over the last few days it has begun radiating into her arm. She was seen by orthopedics two days ago and given Tanya steroid shot into the shoulder joint. Physical exam with full AROM/PROM and without signs of rotator cuff tear or biceps tendonitis. Suspect this could be due to other soft tissue injury or cervical radiculopathy with described radiation into elbow and hand. With previous xrays and physical exam findings, do not suspect this is any acute, emergent pathology.  Pt given 15mg  toradol  and instructed to follow up with PCP as necessary for ongoing pain.    Final diagnoses:  None    ED Discharge Orders     None          Myrna Bitters, DO 04/17/24 0826    Ruthe Cornet, DO 04/17/24 [phone]

## 2024-05-04 ENCOUNTER — Other Ambulatory Visit: Payer: Self-pay | Admitting: Internal Medicine

## 2024-05-04 DIAGNOSIS — E1165 Type 2 diabetes mellitus with hyperglycemia: Secondary | ICD-10-CM

## 2024-05-05 ENCOUNTER — Other Ambulatory Visit: Payer: Self-pay | Admitting: Internal Medicine

## 2024-05-11 ENCOUNTER — Ambulatory Visit: Payer: Medicare Other

## 2024-05-11 DIAGNOSIS — Z0000 Encounter for general adult medical examination without abnormal findings: Secondary | ICD-10-CM | POA: Diagnosis not present

## 2024-05-11 NOTE — Progress Notes (Signed)
 Subjective:   Tanya Levy is a 73 y.o. who presents for a Medicare Wellness preventive visit.  As a reminder, Annual Wellness Visits don't include a physical exam, and some assessments may be limited, especially if this visit is performed virtually. We may recommend an in-person follow-up visit with your provider if needed.  Visit Complete: Virtual I connected with  Tanya Levy on 05/11/24 by a audio enabled telemedicine application and verified that I am speaking with the correct person using two identifiers.  Patient Location: Home  Provider Location: Office/Clinic  I discussed the limitations of evaluation and management by telemedicine. The patient expressed understanding and agreed to proceed.  Vital Signs: Because this visit was a virtual/telehealth visit, some criteria may be missing or patient reported. Any vitals not documented were not able to be obtained and vitals that have been documented are patient reported.  VideoError- Librarian, academic were attempted between this provider and patient, however failed, due to patient having technical difficulties OR patient did not have access to video capability.  We continued and completed visit with audio only.   Persons Participating in Visit: Patient.  AWV Questionnaire: No: Patient Medicare AWV questionnaire was not completed prior to this visit.  Cardiac Risk Factors include: advanced age (>60men, >54 women);diabetes mellitus;hypertension     Objective:    Today's Vitals   There is no height or weight on file to calculate BMI.     05/11/2024   12:02 PM 04/17/2024    7:50 AM 05/06/2023   12:05 PM 06/24/2022   10:00 AM 05/01/2022    9:39 AM 03/28/2021    9:16 AM 03/26/2020   12:44 PM  Advanced Directives  Does Patient Have a Medical Advance Directive? No No No No No No No  Would patient like information on creating a medical advance directive? No - Patient declined      Yes  (MAU/Ambulatory/Procedural Areas - Information given)    Current Medications (verified) Outpatient Encounter Medications as of 05/11/2024  Medication Sig   amLODipine  (NORVASC ) 2.5 MG tablet TAKE 1 TABLET BY MOUTH EVERY DAY   aspirin 81 MG EC tablet Take 81 mg by mouth daily. Swallow whole.   atorvastatin  (LIPITOR) 40 MG tablet Take 1 tablet (40 mg total) by mouth daily.   b complex vitamins tablet Take 1 tablet by mouth daily. 3 times per week   dapagliflozin  propanediol (FARXIGA ) 10 MG TABS tablet Take 1 tablet (10 mg total) by mouth daily before breakfast.   diphenhydrAMINE  (BENADRYL ) 25 MG tablet Take 1 tablet (25 mg total) by mouth every 6 (six) hours as needed for itching or allergies (Rash).   dorzolamide-timolol (COSOPT) 22.3-6.8 MG/ML ophthalmic solution SMARTSIG:In Eye(s)   EPINEPHrine  0.3 mg/0.3 mL IJ SOAJ injection EpiPen  2-Pak 0.3 mg/0.3 mL injection, auto-injector   fluticasone  (FLONASE ) 50 MCG/ACT nasal spray SPRAY 2 SPRAYS INTO EACH NOSTRIL EVERY DAY   latanoprost (XALATAN) 0.005 % ophthalmic solution    levothyroxine  (SYNTHROID ) 100 MCG tablet TAKE 1 TABLET BY MOUTH EVERY DAY   Semaglutide , 1 MG/DOSE, (OZEMPIC , 1 MG/DOSE,) 4 MG/3ML SOPN USE ONCE WEEKLY   valsartan -hydrochlorothiazide  (DIOVAN -HCT) 160-12.5 MG tablet TAKE 1 TABLET BY MOUTH EVERY DAY   No facility-administered encounter medications on file as of 05/11/2024.    Allergies (verified) Shellfish allergy and Shellfish allergy   History: Past Medical History:  Diagnosis Date   Diabetes mellitus (HCC)    Diabetes mellitus without complication (HCC)    History of chicken pox  History of measles    History of mumps    Hypertension    Tilted uterus    UTI (urinary tract infection)    Past Surgical History:  Procedure Laterality Date   EYE SURGERY     KNEE ARTHROSCOPY     KNEE SURGERY     Family History  Problem Relation Age of Onset   Hypertension Mother    Hypertension Father    Social History    Socioeconomic History   Marital status: Married    Spouse name: Not on file   Number of children: Not on file   Years of education: Not on file   Highest education level: Not on file  Occupational History   Not on file  Tobacco Use   Smoking status: Never   Smokeless tobacco: Never  Vaping Use   Vaping status: Never Used  Substance and Sexual Activity   Alcohol use: Yes    Comment: occasional   Drug use: No   Sexual activity: Yes    Birth control/protection: Post-menopausal  Other Topics Concern   Not on file  Social History Narrative   ** Merged History Encounter **       Social Drivers of Health   Financial Resource Strain: Low Risk  (05/11/2024)   Overall Financial Resource Strain (CARDIA)    Difficulty of Paying Living Expenses: Not hard at all  Food Insecurity: No Food Insecurity (05/11/2024)   Hunger Vital Sign    Worried About Running Out of Food in the Last Year: Never true    Ran Out of Food in the Last Year: Never true  Transportation Needs: No Transportation Needs (05/11/2024)   PRAPARE - Administrator, Civil Service (Medical): No    Lack of Transportation (Non-Medical): No  Physical Activity: Insufficiently Active (05/11/2024)   Exercise Vital Sign    Days of Exercise per Week: 3 days    Minutes of Exercise per Session: 30 min  Stress: No Stress Concern Present (05/11/2024)   [employer] of Occupational Health - Occupational Stress Questionnaire    Feeling of Stress: Not at all  Social Connections: Socially Integrated (05/11/2024)   Social Connection and Isolation Panel    Frequency of Communication with Friends and Family: More than three times a week    Frequency of Social Gatherings with Friends and Family: More than three times a week    Attends Religious Services: More than 4 times per year    Active Member of [employer] or Organizations: Yes    Attends Engineer, structural: More than 4 times per year    Marital Status: Married     Tobacco Counseling Counseling given: Not Answered    Clinical Intake:  Pre-visit preparation completed: Yes  Pain : No/denies pain     Nutritional Risks: None Diabetes: Yes CBG done?: No Did pt. bring in CBG monitor from home?: No  Lab Results  Component Value Date   HGBA1C 6.9 (H) 02/11/2024   HGBA1C 6.8 (H) 10/14/2023   HGBA1C 7.1 (H) 06/04/2023     How often do you need to have someone help you when you read instructions, pamphlets, or other written materials from your doctor or pharmacy?: 1 - Never  Interpreter Needed?: No  Information entered by :: NAllen LPN   Activities of Daily Living     05/11/2024   11:57 AM  In your present state of health, do you have any difficulty performing the following activities:  Hearing? 0  Vision? 0  Difficulty concentrating or making decisions? 0  Walking or climbing stairs? 1  Dressing or bathing? 0  Doing errands, shopping? 0  Preparing Food and eating ? N  Using the Toilet? N  In the past six months, have you accidently leaked urine? N  Do you have problems with loss of bowel control? N  Managing your Medications? N  Managing your Finances? N  Housekeeping or managing your Housekeeping? N    Patient Care Team: Jarold Medici, MD as PCP - General (Internal Medicine) Jarold Medici, MD (Internal Medicine) Patrcia Sharper, MD as Consulting Physician (Ophthalmology)  I have updated your Care Teams any recent Medical Services you may have received from other providers in the past year.     Assessment:   This is a routine wellness examination for Tanya Levy.  Hearing/Vision screen Hearing Screening - Comments:: Denies hearing issues Vision Screening - Comments:: Regular eye exams, Dr. Patrcia   Goals Addressed             This Visit's Progress    Patient Stated       05/11/2024, wants to improve balance and lose weight slowly       Depression Screen     05/11/2024   12:10 PM 02/11/2024    9:29 AM  06/04/2023   11:42 AM 05/06/2023   12:07 PM 02/03/2023    2:52 PM 05/14/2022   11:52 AM 05/01/2022    9:39 AM  PHQ 2/9 Scores  PHQ - 2 Score 0 0 0 0 0 0 0  PHQ- 9 Score 0 0 0 0 0      Fall Risk     05/11/2024   12:03 PM 02/11/2024    9:29 AM 06/04/2023   11:42 AM 05/06/2023   12:07 PM 02/03/2023    2:52 PM  Fall Risk   Falls in the past year? 1 0 0 0 0  Comment tripped in boots too big, tripped over box, slipped in bathroom      Number falls in past yr: 1 0 0 0 0  Injury with Fall? 0 0 0 0 0  Risk for fall due to : Medication side effect;Impaired balance/gait Impaired balance/gait No Fall Risks Medication side effect No Fall Risks  Follow up Falls evaluation completed;Falls prevention discussed Falls evaluation completed Falls evaluation completed Falls prevention discussed;Falls evaluation completed Falls evaluation completed    MEDICARE RISK AT HOME:  Medicare Risk at Home Any stairs in or around the home?: No If so, are there any without handrails?: No Home free of loose throw rugs in walkways, pet beds, electrical cords, etc?: Yes Adequate lighting in your home to reduce risk of falls?: Yes Life alert?: No Use of a cane, walker or w/c?: Yes (only sometimes) Grab bars in the bathroom?: Yes Shower chair or bench in shower?: No Elevated toilet seat or a handicapped toilet?: Yes  TIMED UP AND GO:  Was the test performed?  No  Cognitive Function: 6CIT completed        05/11/2024   12:10 PM 05/06/2023   12:08 PM 05/01/2022    9:41 AM 03/28/2021    9:17 AM 03/26/2020   12:00 PM  6CIT Screen  What Year? 0 points 0 points 0 points 0 points 0 points  What month? 0 points 0 points 0 points 0 points 0 points  What time? 0 points 0 points 0 points 0 points 0 points  Count back from 20 0 points 0 points 0 points  0 points 2 points  Months in reverse 0 points 0 points 0 points 0 points 0 points  Repeat phrase 0 points 0 points 0 points 0 points 0 points  Total Score 0 points 0 points  0 points 0 points 2 points    Immunizations Immunization History  Administered Date(s) Administered   DTaP 04/19/2012   Fluad Quad(high Dose 65+) 07/31/2021, 09/01/2022   Fluad Trivalent(High Dose 65+) 06/04/2023   INFLUENZA, HIGH DOSE SEASONAL PF 07/17/2020   Influenza Inj Mdck Quad Pf 06/21/2019   Influenza Nasal 06/24/2018   Influenza,inj,Quad PF,6+ Mos 07/02/2018   Influenza-Unspecified 07/02/2018, 06/21/2019   PFIZER Comirnaty(Gray Top)Covid-19 Tri-Sucrose Vaccine 12/25/2020   PFIZER(Purple Top)SARS-COV-2 Vaccination 10/04/2019, 10/25/2019, 05/22/2020   PNEUMOCOCCAL CONJUGATE-20 11/12/2020   Pfizer Covid-19 Vaccine Bivalent Booster 42yrs & up 07/01/2021   Pfizer(Comirnaty)Fall Seasonal Vaccine 12 years and older 10/14/2023   Pneumococcal Conjugate-13 07/05/2019   Pneumococcal-Unspecified 11/12/2020   Tdap 02/03/2023   Zoster Recombinant(Shingrix) 08/03/2020, 04/26/2021    Screening Tests Health Maintenance  Topic Date Due   COVID-19 Vaccine (7 - Pfizer risk 2024-25 season) 04/12/2024   INFLUENZA VACCINE  04/15/2024   Diabetic kidney evaluation - Urine ACR  06/03/2024   FOOT EXAM  06/03/2024   OPHTHALMOLOGY EXAM  06/28/2024   HEMOGLOBIN A1C  08/13/2024   Diabetic kidney evaluation - eGFR measurement  02/10/2025   Medicare Annual Wellness (AWV)  05/11/2025   MAMMOGRAM  03/15/2026   Colonoscopy  08/02/2027   DTaP/Tdap/Td (3 - Td or Tdap) 02/02/2033   Pneumococcal Vaccine: 50+ Years  Completed   DEXA SCAN  Completed   Hepatitis C Screening  Completed   Zoster Vaccines- Shingrix  Completed   HPV VACCINES  Aged Out   Meningococcal B Vaccine  Aged Out    Health Maintenance  Health Maintenance Due  Topic Date Due   COVID-19 Vaccine (7 - Pfizer risk 2024-25 season) 04/12/2024   INFLUENZA VACCINE  04/15/2024   Diabetic kidney evaluation - Urine ACR  06/03/2024   Health Maintenance Items Addressed: Due for flu vaccine.  Additional Screening:  Vision Screening:  Recommended annual ophthalmology exams for early detection of glaucoma and other disorders of the eye. Would you like a referral to an eye doctor? No    Dental Screening: Recommended annual dental exams for proper oral hygiene  Community Resource Referral / Chronic Care Management: CRR required this visit?  No   CCM required this visit?  No   Plan:    I have personally reviewed and noted the following in the patient's chart:   Medical and social history Use of alcohol, tobacco or illicit drugs  Current medications and supplements including opioid prescriptions. Patient is not currently taking opioid prescriptions. Functional ability and status Nutritional status Physical activity Advanced directives List of other physicians Hospitalizations, surgeries, and ER visits in previous 12 months Vitals Screenings to include cognitive, depression, and falls Referrals and appointments  In addition, I have reviewed and discussed with patient certain preventive protocols, quality metrics, and best practice recommendations. A written personalized care plan for preventive services as well as general preventive health recommendations were provided to patient.   Ardella FORBES Dawn, LPN   [id number]   After Visit Summary: (MyChart) Due to this being a telephonic visit, the after visit summary with patients personalized plan was offered to patient via MyChart   Notes: Nothing significant to report at this time.

## 2024-05-11 NOTE — Patient Instructions (Signed)
 Tanya Levy , Thank you for taking time out of your busy schedule to complete your Annual Wellness Visit with me. I enjoyed our conversation and look forward to speaking with you again next year. I, as well as your care team,  appreciate your ongoing commitment to your health goals. Please review the following plan we discussed and let me know if I can assist you in the future. Your Game plan/ To Do List    Referrals: If you haven't heard from the office you've been referred to, please reach out to them at the phone provided.   Follow up Visits: We will see or speak with you next year for your Next Medicare AWV with our clinical staff Have you seen your provider in the last 6 months (3 months if uncontrolled diabetes)? Yes  Clinician Recommendations:  Aim for 30 minutes of exercise or brisk walking, 6-8 glasses of water, and 5 servings of fruits and vegetables each day.       This is a list of the screenings recommended for you:  Health Maintenance  Topic Date Due   COVID-19 Vaccine (7 - Pfizer risk 2024-25 season) 04/12/2024   Flu Shot  04/15/2024   Yearly kidney health urinalysis for diabetes  06/03/2024   Complete foot exam   06/03/2024   Eye exam for diabetics  06/28/2024   Hemoglobin A1C  08/13/2024   Yearly kidney function blood test for diabetes  02/10/2025   Medicare Annual Wellness Visit  05/11/2025   Mammogram  03/15/2026   Colon Cancer Screening  08/02/2027   DTaP/Tdap/Td vaccine (3 - Td or Tdap) 02/02/2033   Pneumococcal Vaccine for age over 39  Completed   DEXA scan (bone density measurement)  Completed   Hepatitis C Screening  Completed   Zoster (Shingles) Vaccine  Completed   HPV Vaccine  Aged Out   Meningitis B Vaccine  Aged Out    Advanced directives: (ACP Link)Information on Advanced Care Planning can be found at [place]  Best boy Advance Health Care Directives Advance Health Care Directives. [email or web address]  Advance Care Planning is important  because it:  [x]  Makes sure you receive the medical care that is consistent with your values, goals, and preferences  [x]  It provides guidance to your family and loved ones and reduces their decisional burden about whether or not they are making the right decisions based on your wishes.  Follow the link provided in your after visit summary or read over the paperwork we have mailed to you to help you started getting your Advance Directives in place. If you need assistance in completing these, please reach out to us  so that we can help you!  See attachments for Preventive Care and Fall Prevention Tips.

## 2024-05-20 LAB — HM COLONOSCOPY

## 2024-06-13 ENCOUNTER — Ambulatory Visit: Admitting: Internal Medicine

## 2024-06-27 ENCOUNTER — Encounter: Payer: Self-pay | Admitting: Internal Medicine

## 2024-06-27 ENCOUNTER — Other Ambulatory Visit: Payer: Self-pay | Admitting: Internal Medicine

## 2024-06-27 ENCOUNTER — Ambulatory Visit (INDEPENDENT_AMBULATORY_CARE_PROVIDER_SITE_OTHER): Admitting: Internal Medicine

## 2024-06-27 VITALS — BP 126/82 | HR 97 | Temp 98.4°F | Ht 60.0 in | Wt 176.6 lb

## 2024-06-27 DIAGNOSIS — E66811 Obesity, class 1: Secondary | ICD-10-CM

## 2024-06-27 DIAGNOSIS — E785 Hyperlipidemia, unspecified: Secondary | ICD-10-CM

## 2024-06-27 DIAGNOSIS — Z23 Encounter for immunization: Secondary | ICD-10-CM | POA: Diagnosis not present

## 2024-06-27 DIAGNOSIS — I10 Essential (primary) hypertension: Secondary | ICD-10-CM

## 2024-06-27 DIAGNOSIS — E039 Hypothyroidism, unspecified: Secondary | ICD-10-CM

## 2024-06-27 DIAGNOSIS — E6609 Other obesity due to excess calories: Secondary | ICD-10-CM

## 2024-06-27 DIAGNOSIS — E1169 Type 2 diabetes mellitus with other specified complication: Secondary | ICD-10-CM

## 2024-06-27 DIAGNOSIS — Z6834 Body mass index (BMI) 34.0-34.9, adult: Secondary | ICD-10-CM

## 2024-06-27 MED ORDER — FLUTICASONE PROPIONATE 50 MCG/ACT NA SUSP: NASAL | 2 refills | Status: AC

## 2024-06-27 MED ORDER — CRESTOR 20 MG PO TABS: 20.0000 mg | ORAL_TABLET | Freq: Every day | ORAL | 3 refills | Status: AC

## 2024-06-27 NOTE — Patient Instructions (Signed)

## 2024-06-27 NOTE — Progress Notes (Signed)
 I,Tanya Levy, CMA,acting as a Neurosurgeon for Tanya LOISE Slocumb, MD.,have documented all relevant documentation on the behalf of Tanya LOISE Slocumb, MD,as directed by  Tanya LOISE Slocumb, MD while in the presence of Tanya LOISE Slocumb, MD.  Subjective:  Patient ID: Tanya Levy , female    DOB: 02-25-1951 , 73 y.o.   MRN: [id number]  Chief Complaint  Patient presents with   Diabetes    Patient presents today for DM and BP & thyroid  check.  She reports compliance with medications and has no other concerns today. Denies headache, chest pain & SOB.    Hypertension    HPI Discussed the use of AI scribe software for clinical note transcription with the patient, who gave verbal consent to proceed.  History of Present Illness Tanya Levy Tanya Levy is a 73 year old female with diabetes and hypertension who presents for a routine follow-up visit.  Blood sugar levels have been stable, with readings ranging from 105 mg/dL to 803 mg/dL postprandial. The higher reading is atypical for her. She is currently on Ozempic  and Farxiga  for diabetes management.  Hypertension is managed with amlodipine  and valsartan .  She is taking Crestor  for hyperlipidemia management due to intolerance to atorvastatin , which caused chest and arm cramps. She requires prior authorization for Crestor .  Hypothyroidism is managed with levothyroxine , and she is taking the generic form of the medication.  For glaucoma, she uses Cosopt eye drops, having switched from latanoprost due to insurance issues. She does not recall the name of the new eye drop.  She also mentions needing more Flonase .   Diabetes She presents for her follow-up diabetic visit. She has type 2 diabetes mellitus. Her disease course has been improving. There are no hypoglycemic associated symptoms. Pertinent negatives for hypoglycemia include no headaches. Pertinent negatives for diabetes include no chest pain, no fatigue, no polydipsia, no  polyphagia, no polyuria and no weakness. There are no hypoglycemic complications. Risk factors for coronary artery disease include diabetes mellitus, dyslipidemia, hypertension, obesity, sedentary lifestyle and post-menopausal. She is compliant with treatment most of the time. She is following a diabetic diet. She participates in exercise intermittently. Her breakfast blood glucose is taken between 8-9 am. Her breakfast blood glucose range is generally 110-130 mg/dl. An ACE inhibitor/angiotensin II receptor blocker is being taken. Eye exam is current.  Hypertension This is a chronic problem. The current episode started more than 1 year ago. The problem has been gradually improving since onset. The problem is controlled. Pertinent negatives include no chest pain or headaches. Risk factors for coronary artery disease include diabetes mellitus, dyslipidemia, obesity, sedentary lifestyle and post-menopausal state. Compliance problems include exercise.      Past Medical History:  Diagnosis Date   Diabetes mellitus (HCC)    Diabetes mellitus without complication (HCC)    History of chicken pox    History of measles    History of mumps    Hypertension    Tilted uterus    UTI (urinary tract infection)      Family History  Problem Relation Age of Onset   Hypertension Mother    Hypertension Father      Current Outpatient Medications:    amLODipine  (NORVASC ) 2.5 MG tablet, TAKE 1 TABLET BY MOUTH EVERY DAY, Disp: 90 tablet, Rfl: 2   aspirin 81 MG EC tablet, Take 81 mg by mouth daily. Swallow whole., Disp: , Rfl:    b complex vitamins tablet, Take 1 tablet by mouth daily. 3 times  per week, Disp: , Rfl:    dapagliflozin  propanediol (FARXIGA ) 10 MG TABS tablet, Take 1 tablet (10 mg total) by mouth daily before breakfast., Disp: 90 tablet, Rfl: 1   diphenhydrAMINE  (BENADRYL ) 25 MG tablet, Take 1 tablet (25 mg total) by mouth every 6 (six) hours as needed for itching or allergies (Rash)., Disp: 30  tablet, Rfl: 0   dorzolamide-timolol (COSOPT) 22.3-6.8 MG/ML ophthalmic solution, SMARTSIG:In Eye(s), Disp: , Rfl:    EPINEPHrine  0.3 mg/0.3 mL IJ SOAJ injection, EpiPen  2-Pak 0.3 mg/0.3 mL injection, auto-injector, Disp: , Rfl:    levothyroxine  (SYNTHROID ) 100 MCG tablet, TAKE 1 TABLET BY MOUTH EVERY DAY, Disp: 90 tablet, Rfl: 1   Semaglutide , 1 MG/DOSE, (OZEMPIC , 1 MG/DOSE,) 4 MG/3ML SOPN, USE ONCE WEEKLY, Disp: 3 mL, Rfl: 5   valsartan -hydrochlorothiazide  (DIOVAN -HCT) 160-12.5 MG tablet, TAKE 1 TABLET BY MOUTH EVERY DAY, Disp: 90 tablet, Rfl: 2   CRESTOR  20 MG tablet, Take 1 tablet (20 mg total) by mouth daily., Disp: 90 tablet, Rfl: 3   fluticasone  (FLONASE ) 50 MCG/ACT nasal spray, SPRAY 2 SPRAYS INTO EACH NOSTRIL EVERY DAY, Disp: 48 mL, Rfl: 2   Allergies  Allergen Reactions   Shellfish Allergy Anaphylaxis   Shellfish Allergy      Review of Systems  Constitutional: Negative.  Negative for fatigue.  Respiratory: Negative.    Cardiovascular: Negative.  Negative for chest pain.  Gastrointestinal: Negative.   Endocrine: Negative for polydipsia, polyphagia and polyuria.  Neurological: Negative.  Negative for weakness and headaches.  Psychiatric/Behavioral: Negative.       Today's Vitals   06/27/24 1619  BP: 126/82  Pulse: 97  Temp: 98.4 F (36.9 C)  SpO2: 98%  Weight: 176 lb 9.6 oz (80.1 kg)  Height: 5' (1.524 m)   Body mass index is 34.49 kg/m.  Wt Readings from Last 3 Encounters:  06/27/24 176 lb 9.6 oz (80.1 kg)  04/17/24 175 lb (79.4 kg)  02/11/24 175 lb 6.4 oz (79.6 kg)     Objective:  Physical Exam Vitals and nursing note reviewed.  Constitutional:      Appearance: Normal appearance. She is obese.  HENT:     Head: Normocephalic and atraumatic.  Eyes:     Extraocular Movements: Extraocular movements intact.  Cardiovascular:     Rate and Rhythm: Normal rate and regular rhythm.     Pulses:          Dorsalis pedis pulses are 2+ on the right side and 2+ on the  left side.     Heart sounds: Normal heart sounds.  Pulmonary:     Effort: Pulmonary effort is normal.     Breath sounds: Normal breath sounds.  Musculoskeletal:     Cervical back: Normal range of motion.  Feet:     Right foot:     Protective Sensation: 5 sites tested.  5 sites sensed.     Skin integrity: Dry skin present.     Toenail Condition: Right toenails are normal.     Left foot:     Protective Sensation: 5 sites tested.  5 sites sensed.     Skin integrity: Dry skin present.     Toenail Condition: Left toenails are normal.  Skin:    General: Skin is warm.  Neurological:     General: No focal deficit present.     Mental Status: She is alert.  Psychiatric:        Mood and Affect: Mood normal.        Behavior:  Behavior normal.         Assessment And Plan:  Dyslipidemia associated with type 2 diabetes mellitus (HCC) Assessment & Plan: Chronic, glucose levels are stable.  Managed with Ozempic .  Diabetic foot exam was performed. - Continue Ozempic  1 mg weekly. - Importance of medication/dietary compliance was discussed with the patient - LDL goal is less than 70 - Continue with rosuvastatin   Orders: -     CMP14+EGFR -     Hemoglobin A1c -     Microalbumin / creatinine urine ratio  Essential hypertension, benign Assessment & Plan: Chronic, well controlled. She will continue with amlodipine  2.5mg  daily and valsartan /hct 160/12.5mg  daily. She is advised goal BP is less than 120/80. Advised to follow low sodium diet.    Primary hypothyroidism Assessment & Plan: I will check thyroid  panel and adjust meds as needed.  She is currently taking levothyroxine  100mcg daily.    Orders: -     TSH + free T4  Class 1 obesity due to excess calories with serious comorbidity and body mass index (BMI) of 34.0 to 34.9 in adult Assessment & Plan: She is encouraged to strive for BMI less than 30 to decrease cardiac risk. Advised to aim for at least 150 minutes of exercise per  week.     Immunization due -     Flu vaccine HIGH DOSE PF(Fluzone Trivalent)  Other orders -     Fluticasone  Propionate; SPRAY 2 SPRAYS INTO EACH NOSTRIL EVERY DAY  Dispense: 48 mL; Refill: 2 -     Crestor ; Take 1 tablet (20 mg total) by mouth daily.  Dispense: 90 tablet; Refill: 3  General Health Maintenance Medication list requires updating for accurate management. - Update medication list to include Crestor  instead of atorvastatin . - Send prior authorization for Crestor  to [facility] on [address]. - Refill Flonase  nasal spray. Return for 4 MONTH DM F/U.SABRA  Patient was given opportunity to ask questions. Patient verbalized understanding of the plan and was able to repeat key elements of the plan. All questions were answered to their satisfaction.   I, Tanya LOISE Slocumb, MD, have reviewed all documentation for this visit. The documentation on 06/27/24 for the exam, diagnosis, procedures, and orders are all accurate and complete.   IF YOU HAVE BEEN REFERRED TO A SPECIALIST, IT MAY TAKE 1-2 WEEKS TO SCHEDULE/PROCESS THE REFERRAL. IF YOU HAVE NOT HEARD FROM US /SPECIALIST IN TWO WEEKS, PLEASE GIVE US  A CALL AT [phone].   THE PATIENT IS ENCOURAGED TO PRACTICE SOCIAL DISTANCING DUE TO THE COVID-19 PANDEMIC.

## 2024-06-27 NOTE — Assessment & Plan Note (Addendum)
 Chronic, glucose levels are stable.  Managed with Ozempic .  Diabetic foot exam was performed. - Continue Ozempic  1 mg weekly. - Importance of medication/dietary compliance was discussed with the patient - LDL goal is less than 70 - Continue with rosuvastatin 

## 2024-06-27 NOTE — Assessment & Plan Note (Signed)
 She is encouraged to strive for BMI less than 30 to decrease cardiac risk. Advised to aim for at least 150 minutes of exercise per week.

## 2024-06-27 NOTE — Assessment & Plan Note (Signed)
 Chronic, well controlled. She will continue with amlodipine 2.5mg  daily and valsartan/hct 160/12.5mg  daily. She is advised goal BP is less than 120/80. Advised to follow low sodium diet.

## 2024-06-27 NOTE — Assessment & Plan Note (Signed)
 I will check thyroid panel and adjust meds as needed.  She is currently taking levothyroxine daily.

## 2024-06-28 LAB — CMP14+EGFR
ALT: 42 IU/L — ABNORMAL HIGH (ref 0–32)
AST: 40 IU/L (ref 0–40)
Albumin: 4.5 g/dL (ref 3.8–4.8)
Alkaline Phosphatase: 66 IU/L (ref 49–135)
BUN/Creatinine Ratio: 17 (ref 12–28)
BUN: 16 mg/dL (ref 8–27)
Bilirubin Total: 0.3 mg/dL (ref 0.0–1.2)
CO2: 25 mmol/L (ref 20–29)
Calcium: 9.5 mg/dL (ref 8.7–10.3)
Chloride: 103 mmol/L (ref 96–106)
Creatinine, Ser: 0.93 mg/dL (ref 0.57–1.00)
Globulin, Total: 2.7 g/dL (ref 1.5–4.5)
Glucose: 88 mg/dL (ref 70–99)
Potassium: 4.7 mmol/L (ref 3.5–5.2)
Sodium: 141 mmol/L (ref 134–144)
Total Protein: 7.2 g/dL (ref 6.0–8.5)
eGFR: 66 mL/min/1.73 (ref >59)

## 2024-06-28 LAB — HEMOGLOBIN A1C
Est. average glucose Bld gHb Est-mCnc: 160 mg/dL
Hgb A1c MFr Bld: 7.2 % — ABNORMAL HIGH (ref 4.8–5.6)

## 2024-06-28 LAB — MICROALBUMIN / CREATININE URINE RATIO
Creatinine, Urine: 73.8 mg/dL
Microalb/Creat Ratio: <4 mg/g{creat} (ref 0–29)
Microalbumin, Urine: <3 ug/mL

## 2024-06-28 LAB — TSH+FREE T4
Free T4: 1.12 ng/dL (ref 0.82–1.77)
TSH: 3.15 u[IU]/mL (ref 0.450–4.500)

## 2024-07-04 ENCOUNTER — Ambulatory Visit: Payer: Self-pay | Admitting: Internal Medicine

## 2024-10-07 ENCOUNTER — Other Ambulatory Visit: Payer: Self-pay | Admitting: Internal Medicine

## 2024-10-07 DIAGNOSIS — E1165 Type 2 diabetes mellitus with hyperglycemia: Secondary | ICD-10-CM

## 2024-10-15 ENCOUNTER — Other Ambulatory Visit: Payer: Self-pay | Admitting: Internal Medicine

## 2024-11-09 ENCOUNTER — Ambulatory Visit: Payer: Self-pay | Admitting: Internal Medicine

## 2025-02-14 ENCOUNTER — Ambulatory Visit: Payer: Self-pay | Admitting: Internal Medicine

## 2025-06-07 ENCOUNTER — Ambulatory Visit: Payer: Self-pay
